# Patient Record
Sex: Female | Born: 1990 | Hispanic: Yes | Smoking: Never smoker
Health system: Southern US, Community
[De-identification: ages and names within clinical notes are randomized; demographics above are authoritative.]

## PROBLEM LIST (undated history)

## (undated) DIAGNOSIS — O1490 Unspecified pre-eclampsia, unspecified trimester: Secondary | ICD-10-CM

## (undated) DIAGNOSIS — M7989 Other specified soft tissue disorders: Secondary | ICD-10-CM

## (undated) DIAGNOSIS — F419 Anxiety disorder, unspecified: Secondary | ICD-10-CM

## (undated) DIAGNOSIS — F32A Depression, unspecified: Secondary | ICD-10-CM

## (undated) DIAGNOSIS — K859 Acute pancreatitis without necrosis or infection, unspecified: Secondary | ICD-10-CM

## (undated) DIAGNOSIS — G43909 Migraine, unspecified, not intractable, without status migrainosus: Secondary | ICD-10-CM

## (undated) DIAGNOSIS — I83813 Varicose veins of bilateral lower extremities with pain: Secondary | ICD-10-CM

## (undated) DIAGNOSIS — F329 Major depressive disorder, single episode, unspecified: Secondary | ICD-10-CM

## (undated) HISTORY — PX: NO PAST SURGERIES: SHX2092

---

## 2010-09-17 ENCOUNTER — Inpatient Hospital Stay (HOSPITAL_COMMUNITY)
Admission: AD | Admit: 2010-09-17 | Discharge: 2010-09-17 | Payer: Self-pay | Source: Home / Self Care | Attending: Obstetrics & Gynecology | Admitting: Obstetrics & Gynecology

## 2010-10-10 DIAGNOSIS — O1490 Unspecified pre-eclampsia, unspecified trimester: Secondary | ICD-10-CM

## 2010-10-10 HISTORY — DX: Unspecified pre-eclampsia, unspecified trimester: O14.90

## 2010-10-28 ENCOUNTER — Ambulatory Visit (HOSPITAL_COMMUNITY)
Admission: RE | Admit: 2010-10-28 | Discharge: 2010-10-28 | Payer: Self-pay | Source: Home / Self Care | Attending: Obstetrics & Gynecology | Admitting: Obstetrics & Gynecology

## 2010-12-21 LAB — COMPREHENSIVE METABOLIC PANEL
AST: 26 U/L (ref 0–37)
Albumin: 3.5 g/dL (ref 3.5–5.2)
Calcium: 9.3 mg/dL (ref 8.4–10.5)
Creatinine, Ser: 0.43 mg/dL (ref 0.4–1.2)
GFR calc Af Amer: >60 mL/min (ref >60)
Sodium: 135 mEq/L (ref 135–145)
Total Protein: 7.3 g/dL (ref 6.0–8.3)

## 2010-12-21 LAB — URINALYSIS, ROUTINE W REFLEX MICROSCOPIC
Bilirubin Urine: NEGATIVE
Ketones, ur: NEGATIVE mg/dL
Nitrite: NEGATIVE
Specific Gravity, Urine: 1.015 (ref 1.005–1.030)
Urobilinogen, UA: 0.2 mg/dL (ref 0.0–1.0)

## 2010-12-21 LAB — DIFFERENTIAL
Eosinophils Relative: 1 % (ref 0–5)
Lymphocytes Relative: 11 % — ABNORMAL LOW (ref 12–46)
Lymphs Abs: 0.8 10*3/uL (ref 0.7–4.0)
Monocytes Absolute: 0.6 10*3/uL (ref 0.1–1.0)
Monocytes Relative: 8 % (ref 3–12)

## 2010-12-21 LAB — CBC
MCH: 32 pg (ref 26.0–34.0)
MCHC: 34.3 g/dL (ref 30.0–36.0)
Platelets: 163 10*3/uL (ref 150–400)

## 2010-12-21 LAB — VARICELLA ZOSTER ANTIBODY, IGG: Varicella IgG: 0.85 {ISR}

## 2011-03-09 ENCOUNTER — Inpatient Hospital Stay (HOSPITAL_COMMUNITY)
Admission: EM | Admit: 2011-03-09 | Discharge: 2011-03-12 | DRG: 774 | Disposition: A | Discharge status: Home / Self Care | Payer: Medicaid Other | Source: Ambulatory Visit | Attending: Family Medicine | Admitting: Family Medicine

## 2011-03-09 DIAGNOSIS — O4290 Premature rupture of membranes, unspecified as to length of time between rupture and onset of labor, unspecified weeks of gestation: Principal | ICD-10-CM | POA: Diagnosis present

## 2011-03-09 DIAGNOSIS — IMO0002 Reserved for concepts with insufficient information to code with codable children: Secondary | ICD-10-CM | POA: Diagnosis not present

## 2011-03-09 LAB — COMPREHENSIVE METABOLIC PANEL
ALT: 15 U/L (ref 0–35)
AST: 22 U/L (ref 0–37)
CO2: 18 mEq/L — ABNORMAL LOW (ref 19–32)
Chloride: 106 mEq/L (ref 96–112)
Creatinine, Ser: 0.66 mg/dL (ref 0.4–1.2)
GFR calc Af Amer: >60 mL/min (ref >60)
GFR calc non Af Amer: >60 mL/min (ref >60)
Total Bilirubin: 0.2 mg/dL — ABNORMAL LOW (ref 0.3–1.2)

## 2011-03-09 LAB — URINALYSIS, DIPSTICK ONLY
Bilirubin Urine: NEGATIVE
Ketones, ur: NEGATIVE mg/dL
Nitrite: NEGATIVE
Specific Gravity, Urine: 1.01 (ref 1.005–1.030)
Urobilinogen, UA: 0.2 mg/dL (ref 0.0–1.0)

## 2011-03-09 LAB — CBC
HCT: 38.7 % (ref 36.0–46.0)
Hemoglobin: 12.8 g/dL (ref 12.0–15.0)
MCH: 30.2 pg (ref 26.0–34.0)
RBC: 4.24 MIL/uL (ref 3.87–5.11)

## 2011-03-10 DIAGNOSIS — IMO0002 Reserved for concepts with insufficient information to code with codable children: Secondary | ICD-10-CM

## 2011-03-10 DIAGNOSIS — O4290 Premature rupture of membranes, unspecified as to length of time between rupture and onset of labor, unspecified weeks of gestation: Secondary | ICD-10-CM

## 2011-03-11 ENCOUNTER — Inpatient Hospital Stay (HOSPITAL_COMMUNITY): Admission: AD | Admit: 2011-03-11 | Payer: Self-pay | Source: Home / Self Care | Admitting: Obstetrics & Gynecology

## 2011-03-11 LAB — CBC
HCT: 22.7 % — ABNORMAL LOW (ref 36.0–46.0)
Hemoglobin: 7.5 g/dL — ABNORMAL LOW (ref 12.0–15.0)
MCV: 91.9 fL (ref 78.0–100.0)
RBC: 2.47 MIL/uL — ABNORMAL LOW (ref 3.87–5.11)
WBC: 18.9 10*3/uL — ABNORMAL HIGH (ref 4.0–10.5)

## 2011-03-14 ENCOUNTER — Inpatient Hospital Stay (HOSPITAL_COMMUNITY)
Admission: AD | Admit: 2011-03-14 | Discharge: 2011-03-15 | Disposition: A | Discharge status: Home / Self Care | Payer: Self-pay | Source: Ambulatory Visit | Attending: Obstetrics & Gynecology | Admitting: Obstetrics & Gynecology

## 2011-03-14 DIAGNOSIS — O99893 Other specified diseases and conditions complicating puerperium: Secondary | ICD-10-CM

## 2011-03-14 DIAGNOSIS — O9989 Other specified diseases and conditions complicating pregnancy, childbirth and the puerperium: Secondary | ICD-10-CM

## 2011-03-14 DIAGNOSIS — L509 Urticaria, unspecified: Secondary | ICD-10-CM | POA: Insufficient documentation

## 2012-08-14 ENCOUNTER — Encounter (HOSPITAL_COMMUNITY): Payer: Self-pay | Admitting: *Deleted

## 2012-08-14 ENCOUNTER — Inpatient Hospital Stay (HOSPITAL_COMMUNITY)
Admission: EM | Admit: 2012-08-14 | Discharge: 2012-08-19 | DRG: 417 | Disposition: A | Discharge status: Home / Self Care | Payer: Medicaid Other | Attending: Internal Medicine | Admitting: Internal Medicine

## 2012-08-14 DIAGNOSIS — I10 Essential (primary) hypertension: Secondary | ICD-10-CM | POA: Diagnosis present

## 2012-08-14 DIAGNOSIS — Z9049 Acquired absence of other specified parts of digestive tract: Secondary | ICD-10-CM

## 2012-08-14 DIAGNOSIS — K851 Biliary acute pancreatitis without necrosis or infection: Secondary | ICD-10-CM | POA: Diagnosis present

## 2012-08-14 DIAGNOSIS — K8020 Calculus of gallbladder without cholecystitis without obstruction: Secondary | ICD-10-CM

## 2012-08-14 DIAGNOSIS — Z23 Encounter for immunization: Secondary | ICD-10-CM

## 2012-08-14 DIAGNOSIS — K8010 Calculus of gallbladder with chronic cholecystitis without obstruction: Principal | ICD-10-CM | POA: Diagnosis present

## 2012-08-14 DIAGNOSIS — K859 Acute pancreatitis without necrosis or infection, unspecified: Secondary | ICD-10-CM | POA: Diagnosis present

## 2012-08-14 HISTORY — DX: Depression, unspecified: F32.A

## 2012-08-14 HISTORY — DX: Unspecified pre-eclampsia, unspecified trimester: O14.90

## 2012-08-14 HISTORY — DX: Anxiety disorder, unspecified: F41.9

## 2012-08-14 HISTORY — DX: Other specified soft tissue disorders: M79.89

## 2012-08-14 HISTORY — DX: Major depressive disorder, single episode, unspecified: F32.9

## 2012-08-14 HISTORY — DX: Varicose veins of bilateral lower extremities with pain: I83.813

## 2012-08-14 HISTORY — DX: Migraine, unspecified, not intractable, without status migrainosus: G43.909

## 2012-08-14 HISTORY — DX: Acute pancreatitis without necrosis or infection, unspecified: K85.90

## 2012-08-14 NOTE — ED Notes (Signed)
Pt in c/o epigastric abd pain over last month, states pain is intermittent but today pain has increased, pain is worse with food, some episodes of n/v

## 2012-08-14 NOTE — ED Notes (Signed)
Pt states that everyday after she eats she has pain in her abdomen. Pt states that the pain is burning pain epigastric that goes through to her back. Pt states that she is N/V a few times after she eats.

## 2012-08-15 ENCOUNTER — Emergency Department (HOSPITAL_COMMUNITY): Payer: Medicaid Other

## 2012-08-15 ENCOUNTER — Encounter (HOSPITAL_COMMUNITY): Payer: Self-pay | Admitting: Radiology

## 2012-08-15 DIAGNOSIS — K851 Biliary acute pancreatitis without necrosis or infection: Secondary | ICD-10-CM | POA: Diagnosis present

## 2012-08-15 DIAGNOSIS — K859 Acute pancreatitis without necrosis or infection, unspecified: Secondary | ICD-10-CM

## 2012-08-15 DIAGNOSIS — K8050 Calculus of bile duct without cholangitis or cholecystitis without obstruction: Secondary | ICD-10-CM

## 2012-08-15 HISTORY — DX: Acute pancreatitis without necrosis or infection, unspecified: K85.90

## 2012-08-15 LAB — URINALYSIS, ROUTINE W REFLEX MICROSCOPIC
Glucose, UA: NEGATIVE mg/dL
Nitrite: NEGATIVE
Protein, ur: NEGATIVE mg/dL
Urobilinogen, UA: 0.2 mg/dL (ref 0.0–1.0)

## 2012-08-15 LAB — CREATININE, SERUM: Creatinine, Ser: 0.55 mg/dL (ref 0.50–1.10)

## 2012-08-15 LAB — CBC WITH DIFFERENTIAL/PLATELET
Basophils Absolute: 0 10*3/uL (ref 0.0–0.1)
Eosinophils Absolute: 0 10*3/uL (ref 0.0–0.7)
Lymphocytes Relative: 6 % — ABNORMAL LOW (ref 12–46)
Lymphs Abs: 1.3 10*3/uL (ref 0.7–4.0)
MCH: 29.2 pg (ref 26.0–34.0)
Neutrophils Relative %: 90 % — ABNORMAL HIGH (ref 43–77)
Platelets: 288 10*3/uL (ref 150–400)
RBC: 4.42 MIL/uL (ref 3.87–5.11)
WBC: 21.2 10*3/uL — ABNORMAL HIGH (ref 4.0–10.5)

## 2012-08-15 LAB — COMPREHENSIVE METABOLIC PANEL
BUN: 11 mg/dL (ref 6–23)
CO2: 26 mEq/L (ref 19–32)
Chloride: 100 mEq/L (ref 96–112)
Creatinine, Ser: 0.63 mg/dL (ref 0.50–1.10)
GFR calc non Af Amer: >90 mL/min (ref >90)
Total Bilirubin: 0.8 mg/dL (ref 0.3–1.2)

## 2012-08-15 LAB — CBC
Hemoglobin: 12.6 g/dL (ref 12.0–15.0)
MCH: 28.9 pg (ref 26.0–34.0)
RBC: 4.36 MIL/uL (ref 3.87–5.11)

## 2012-08-15 LAB — LIPID PANEL
Cholesterol: 119 mg/dL (ref 0–200)
Total CHOL/HDL Ratio: 2.1 RATIO
Triglycerides: 61 mg/dL (ref <150)
VLDL: 12 mg/dL (ref 0–40)

## 2012-08-15 LAB — URINE MICROSCOPIC-ADD ON

## 2012-08-15 LAB — PREGNANCY, URINE: Preg Test, Ur: NEGATIVE

## 2012-08-15 MED ORDER — ENOXAPARIN SODIUM 40 MG/0.4ML ~~LOC~~ SOLN
40.0000 mg | SUBCUTANEOUS | Status: DC
  Administered 2012-08-15 – 2012-08-16 (×2): 40 mg via SUBCUTANEOUS
  Filled 2012-08-15 (×3): qty 0.4

## 2012-08-15 MED ORDER — IOHEXOL 300 MG/ML  SOLN
100.0000 mL | Freq: Once | INTRAMUSCULAR | Status: AC | PRN
  Administered 2012-08-15: 100 mL via INTRAVENOUS

## 2012-08-15 MED ORDER — ONDANSETRON HCL 4 MG/2ML IJ SOLN: 4.0000 mg | Freq: Four times a day (QID) | INTRAMUSCULAR | Status: DC | PRN

## 2012-08-15 MED ORDER — IOHEXOL 300 MG/ML  SOLN
20.0000 mL | INTRAMUSCULAR | Status: AC
  Administered 2012-08-15: 20 mL via ORAL

## 2012-08-15 MED ORDER — ONDANSETRON HCL 4 MG/2ML IJ SOLN
INTRAMUSCULAR | Status: AC
  Administered 2012-08-15: 4 mg
  Filled 2012-08-15: qty 2

## 2012-08-15 MED ORDER — GI COCKTAIL ~~LOC~~
30.0000 mL | Freq: Once | ORAL | Status: AC
  Administered 2012-08-15: 30 mL via ORAL
  Filled 2012-08-15: qty 30

## 2012-08-15 MED ORDER — HYDROMORPHONE HCL PF 1 MG/ML IJ SOLN
1.0000 mg | Freq: Once | INTRAMUSCULAR | Status: AC
  Administered 2012-08-15: 1 mg via INTRAVENOUS
  Filled 2012-08-15: qty 1

## 2012-08-15 MED ORDER — ONDANSETRON HCL 4 MG PO TABS: 4.0000 mg | ORAL_TABLET | Freq: Four times a day (QID) | ORAL | Status: DC | PRN

## 2012-08-15 MED ORDER — MORPHINE SULFATE 2 MG/ML IJ SOLN
2.0000 mg | INTRAMUSCULAR | Status: DC | PRN
  Administered 2012-08-15 (×2): 2 mg via INTRAVENOUS
  Filled 2012-08-15 (×2): qty 1

## 2012-08-15 MED ORDER — SODIUM CHLORIDE 0.9 % IV SOLN: INTRAVENOUS | Status: DC

## 2012-08-15 MED ORDER — INFLUENZA VIRUS VACC SPLIT PF IM SUSP
0.5000 mL | INTRAMUSCULAR | Status: AC
  Administered 2012-08-16: 0.5 mL via INTRAMUSCULAR
  Filled 2012-08-15: qty 0.5

## 2012-08-15 MED ORDER — SODIUM CHLORIDE 0.9 % IV SOLN
INTRAVENOUS | Status: DC
  Administered 2012-08-15 (×3): via INTRAVENOUS
  Administered 2012-08-16: 1000 mL via INTRAVENOUS
  Administered 2012-08-16: via INTRAVENOUS
  Administered 2012-08-16 (×2): 1000 mL via INTRAVENOUS
  Administered 2012-08-17: 05:00:00 via INTRAVENOUS

## 2012-08-15 NOTE — H&P (Signed)
Date: 08/15/2012               Patient Name:  Mary Mcintosh MRN: [id number]  DOB: May 21, 1991 Age / Sex: 21 y.o., female   PCP: None              Medical Service: Internal Medicine Teaching Service              Attending Physician: Dr. Meredith Pel    First Contact: Dr. Heloise Beecham Pager: [phone]  Second Contact: Dr. Bosie Clos Pager: [phone]            After Hours (After 5p/  First Contact Pager: [phone]  weekends / holidays): Second Contact Pager: [phone]     Chief Complaint: N/V/abdominal pain.  History of Present Illness: Patient is a 21 y.o. female with no known PMHx, who presents to [facility] for evaluation of 3 day history of intermittent, epigastric abdominal pain with radiation to the back that has gradually worsening, especially over the last 1 day (starting at [facility] on 11/5). This prompted her ER evaluation today.Pain is described as a burning epigastric pain that radiates to the back. She additionally experienced 3-4 episodes of vomiting of a nonbloody nonbilious vomitus. Aggravating factors include: drinking or drinking.  Alleviating factors include: none. Associated symptoms include: constipation and dysuria. The patient denies chills, fever, hematochezia, hematuria and melena. Patient states she had similar (but less intense) episodes when she was 21 years old, for approximately a year, it was never determined the cause of these attacks, and eventually she grew out of it.  Otherwise, patient states she takes no new medications, no recent increased alcohol consumption, and no family or personal history of hypertriglyceridemia. She has never previously had abdominal sugeries.   Review of Systems: Constitutional:  admits to decreased appetite. Denies fever, chills, diaphoresis, appetite change and fatigue.  HEENT: denies photophobia, eye pain, redness, hearing loss, ear pain, congestion, sore throat, rhinorrhea, sneezing, neck pain, neck stiffness and tinnitus.  Respiratory:  denies SOB, DOE, cough, chest tightness, and wheezing.  Cardiovascular: denies chest pain, palpitations and leg swelling.  Gastrointestinal: admits to nausea, vomiting, abdominal pain, constipation. Denies blood in stool.  Genitourinary: admits to dysuria. Denies urgency, frequency, hematuria, flank pain and difficulty urinating.  Musculoskeletal: denies  myalgias, back pain, joint swelling, arthralgias and gait problem.   Skin: denies pallor, rash and wound.  Neurological: denies dizziness, seizures, syncope, weakness, light-headedness, numbness and headaches.   Hematological: denies adenopathy, easy bruising, personal or family bleeding history.  Psychiatric/ Behavioral: denies suicidal ideation, mood changes, confusion, nervousness, sleep disturbance and agitation.    Current Outpatient Medications: Current Facility-Administered Medications  Medication Dose Route Frequency Provider Last Rate Last Dose  . [COMPLETED] gi cocktail (Maalox,Lidocaine,Donnatal)  30 mL Oral Once Vida Roller, MD   30 mL at 08/15/12 0020  . [COMPLETED] HYDROmorphone (DILAUDID) injection 1 mg  1 mg Intravenous Once Vida Roller, MD   1 mg at 08/15/12 0333  . [COMPLETED] iohexol (OMNIPAQUE) 300 MG/ML solution 100 mL  100 mL Intravenous Once PRN Medication Radiologist, MD   100 mL at 08/15/12 0535  . [EXPIRED] iohexol (OMNIPAQUE) 300 MG/ML solution 20 mL  20 mL Oral Q1 Hr x 2 Medication Radiologist, MD   20 mL at 08/15/12 0340  . [COMPLETED] ondansetron (ZOFRAN) 4 MG/2ML injection        4 mg at 08/15/12 0022   No current outpatient prescriptions on file.    Allergies: No Known Allergies   Past Medical  History: Past Medical History  Diagnosis Date  . Pre-eclampsia     with last child in 2012    Past Surgical History: History reviewed. No pertinent past surgical history.  Family History: History reviewed. No pertinent family history.  Social History: History   Social History  . Marital  Status: Single    Spouse Name: N/A    Number of Children: 1  . Years of Education: 9th   Occupational History  . [employer]    Social History Main Topics  . Smoking status: Never Smoker   . Smokeless tobacco: Not on file  . Alcohol Use: Yes     Comment: occasional - 1 drink a month  . Drug Use: No  . Sexually Active: Not on file   Other Topics Concern  . Not on file   Social History Narrative   Lives in [place] with her boyfriend and child     Vital Signs: Blood pressure 101/53, pulse 72, temperature 98.4 F (36.9 C), temperature source Oral, resp. rate 16, last menstrual period 07/29/2012, SpO2 98.00%.  Physical Exam: General: Vital signs reviewed and noted. Well-developed, well-nourished, in no acute distress; alert, appropriate and cooperative throughout examination.  Head: Normocephalic, atraumatic.  Eyes: PERRL, EOMI, No signs of anemia or jaundince.  Nose: Mucous membranes moist, not inflammed, nonerythematous.  Throat: Oropharynx nonerythematous, no exudate appreciated.   Neck: No deformities, masses, or tenderness noted.Supple, No carotid Bruits, no JVD.  Lungs:  Normal respiratory effort. Clear to auscultation BL without crackles or wheezes.  Heart: RRR. S1 and S2 normal without gallop, murmur, or rubs.  Abdomen:  BS normoactive. Soft, Nondistended,. Epigastric tenderness to palpation and RUQ, with negative Murphy sign No masses or organomegaly.  Extremities: No pretibial edema.  Neurologic: A&O X3, CN II - XII are grossly intact. Motor strength is 5/5 in the all 4 extremities, Sensations intact to light touch, Cerebellar signs negative.  Skin: No visible rashes, scars.   Lab results: Comprehensive Metabolic Panel:    Component Value Date/Time   NA 136 08/15/2012 0019   K 3.8 08/15/2012 0019   CL 100 08/15/2012 0019   CO2 26 08/15/2012 0019   BUN 11 08/15/2012 0019   CREATININE 0.63 08/15/2012 0019   GLUCOSE 159* 08/15/2012 0019   CALCIUM 9.9 08/15/2012  0019   AST 84* 08/15/2012 0019   ALT 54* 08/15/2012 0019   ALKPHOS 92 08/15/2012 0019   BILITOT 0.8 08/15/2012 0019   PROT 8.0 08/15/2012 0019   ALBUMIN 4.3 08/15/2012 0019    Lipase     Component Value Date/Time   LIPASE >3000* 08/15/2012 0019   CBC:    Component Value Date/Time   WBC 21.2* 08/15/2012 0019   HGB 12.9 08/15/2012 0019   HCT 38.5 08/15/2012 0019   PLT 288 08/15/2012 0019   MCV 87.1 08/15/2012 0019   NEUTROABS 19.2* 08/15/2012 0019   LYMPHSABS 1.3 08/15/2012 0019   MONOABS 0.7 08/15/2012 0019   EOSABS 0.0 08/15/2012 0019   BASOSABS 0.0 08/15/2012 0019    Urinalysis    Component Value Date/Time   COLORURINE YELLOW 08/15/2012 0314   APPEARANCEUR CLEAR 08/15/2012 0314   LABSPEC 1.012 08/15/2012 0314   PHURINE 8.5* 08/15/2012 0314   GLUCOSEU NEGATIVE 08/15/2012 0314   HGBUR NEGATIVE 08/15/2012 0314   BILIRUBINUR NEGATIVE 08/15/2012 0314   KETONESUR 15* 08/15/2012 0314   PROTEINUR NEGATIVE 08/15/2012 0314   UROBILINOGEN 0.2 08/15/2012 0314   NITRITE NEGATIVE 08/15/2012 0314   LEUKOCYTESUR TRACE* 08/15/2012 0314  Imaging results:   US Abdomen Complete (08/15/2012) - 1.  Question of trace fluid about the pancreas.  Given the patient's elevated lipase, pancreatitis is considered likely. 2.  Cholelithiasis; apparent thickening of the gallbladder wall to 0.5 cm, without additional evidence to suggest cholecystitis.  No ultrasonographic Murphy's sign seen. 3.  Scattered calcified granulomata within the spleen.   Original Report Authenticated By: Tonia Ghent, M.D.    Ct Abdomen Pelvis W Contrast (08/15/2012) - 1.  Acute pancreatitis noted, with fluid tracking about the entirety of the pancreas, and extending inferiorly along Gerota's fascia bilaterally, more prominent on the left.  Associated soft tissue inflammation seen.  No definite evidence of devascularization or pseudocyst formation, though evaluation is mildly suboptimal due to motion artifact. 2.  Small amount of pericholecystic fluid  about the gallbladder may be reactive in nature; known cholelithiasis not well characterized on CT.  No evidence to suggest obstruction. 3.  Intrauterine device noted in abnormally low position in the uterus, with one of the prongs apparently extending into the myometrium.  Recommend clinical correlation and adjustment.   Original Report Authenticated By: Tonia Ghent, M.D.     Assessment & Plan:  Pt is a 21 y.o. yo female with no known PMHx who was admitted on 08/14/2012 with symptoms of nausea, vomiting, and abdominal pain x 3 days, worst over 1 day PTA, which was determined to be secondary to acute pancreatitis. Interventions at this time will be focused on supportive care, while obtaining a GI consult for further evaluation.    1) Acute pancreatitis - No clear indication of obstructing cholelithiasis on CT or abdominal US. However, ductal evaluation not great per these studies - therefore, stone may be present. She has nonobstructing cholelithiasis without evidence of cholecystitis. Medication or alcohol related pancreatitis is not expected given lack of usage. She also does not indication on physical exam of hypertriglyceridemia. No hypercalcemia to suggest association. Given young age, absence of B symptoms, she is less likely to have a primary malignancy as cause of pancreatitis.  Plan: - Will obtain GI consult to consider for further evaluation of cause of this pancreatitis (with consideration for possible endocscopy etiology), as well if decompression is needed.  - NPO. - Will add IVF @ 200 cc/hr x 8 hours, then 150 cc/hr. - Will check serum triglycerides. - Check hepatitis panel, HIV.    DVT PPX - low molecular weight heparin  CODE STATUS - FULL code  Dispo - Deferred until GI evaluation, symptoms improved, pancreatitis improved clinically.    Signed: Johnette Abraham, Roma Schanz, Internal Medicine Resident Pager: [phone] (7AM-5PM) 08/15/2012, 7:45 AM

## 2012-08-15 NOTE — ED Provider Notes (Signed)
History     CSN: [id number]  Arrival date & time 08/14/12  2128   First MD Initiated Contact with Patient 08/15/12 0011      Chief Complaint  Patient presents with  . Abdominal Pain    (Consider location/radiation/quality/duration/timing/severity/associated sxs/prior treatment) HPI Comments: 21 year old female with a history of epigastric abdominal pain that has been going on approximately one month, worse in the evening, worse after meals and associated with nausea and vomiting after she eats. The pain is burning in nature, radiates to her back, is not associated with fever chills, cough, shortness of breath, chest pain, diarrhea, dysuria, vaginal bleeding, swelling, rashes. The symptoms are moderate to severe at this time. She has had no medications for this. She does have a history of using over-the-counter medications intermittently such as ibuprofen.  Patient is a 21 y.o. female presenting with abdominal pain. The history is provided by the patient.  Abdominal Pain The primary symptoms of the illness include abdominal pain.    Past Medical History  Diagnosis Date  . Hypertension     History reviewed. No pertinent past surgical history.  History reviewed. No pertinent family history.  History  Substance Use Topics  . Smoking status: Never Smoker   . Smokeless tobacco: Not on file  . Alcohol Use: No    OB History    Grav Para Term Preterm Abortions TAB SAB Ect Mult Living                  Review of Systems  Gastrointestinal: Positive for abdominal pain.  All other systems reviewed and are negative.    Allergies  Review of patient's allergies indicates no known allergies.  Home Medications  No current outpatient prescriptions on file.  BP 114/64  Pulse 69  Temp 98.4 F (36.9 C) (Oral)  Resp 16  SpO2 96%  LMP 07/29/2012  Physical Exam  Nursing note and vitals reviewed. Constitutional: She appears well-developed and well-nourished.   Uncomfortable appearing  HENT:  Head: Normocephalic and atraumatic.  Mouth/Throat: Oropharynx is clear and moist. No oropharyngeal exudate.  Eyes: Conjunctivae normal and EOM are normal. Pupils are equal, round, and reactive to light. Right eye exhibits no discharge. Left eye exhibits no discharge. No scleral icterus.  Neck: Normal range of motion. Neck supple. No JVD present. No thyromegaly present.  Cardiovascular: Normal rate, regular rhythm, normal heart sounds and intact distal pulses.  Exam reveals no gallop and no friction rub.   No murmur heard. Pulmonary/Chest: Effort normal and breath sounds normal. No respiratory distress. She has no wheezes. She has no rales.  Abdominal: Soft. Bowel sounds are normal. She exhibits no distension and no mass. There is tenderness ( Patient has focal right upper quadrant and epigastric tenderness to palpation without guarding or masses. There is no peritoneal signs, the abdomen is very soft and no pain at the right lower quadrant).  Musculoskeletal: Normal range of motion. She exhibits no edema and no tenderness.  Lymphadenopathy:    She has no cervical adenopathy.  Neurological: She is alert. Coordination normal.  Skin: Skin is warm and dry. No rash noted. No erythema.  Psychiatric: She has a normal mood and affect. Her behavior is normal.    ED Course  Procedures (including critical care time)  Labs Reviewed  COMPREHENSIVE METABOLIC PANEL - Abnormal; Notable for the following:    Glucose, Bld 159 (*)     AST 84 (*)     ALT 54 (*)  All other components within normal limits  CBC WITH DIFFERENTIAL - Abnormal; Notable for the following:    WBC 21.2 (*)     Neutrophils Relative 90 (*)     Neutro Abs 19.2 (*)     Lymphocytes Relative 6 (*)     All other components within normal limits  URINALYSIS, ROUTINE W REFLEX MICROSCOPIC - Abnormal; Notable for the following:    pH 8.5 (*)     Ketones, ur 15 (*)     Leukocytes, UA TRACE (*)     All  other components within normal limits  LIPASE, BLOOD - Abnormal; Notable for the following:    Lipase >3000 (*)  RESULT CONFIRMED BY AUTOMATED DILUTION   All other components within normal limits  URINE MICROSCOPIC-ADD ON - Abnormal; Notable for the following:    Squamous Epithelial / LPF MANY (*)     All other components within normal limits  PREGNANCY, URINE   US Abdomen Complete  08/15/2012  *RADIOLOGY REPORT*  Clinical Data:  Right upper quadrant abdominal pain, elevated LFTs, leukocytosis and elevated lipase.  ABDOMINAL ULTRASOUND COMPLETE  Comparison:  None  Findings:  Gallbladder: Multiple stones of varying size are noted within the gallbladder, several which are adherent to the gallbladder wall. The gallbladder wall appears thickened, measuring 0.5 cm, but no significant pericholecystic fluid is seen, and no ultrasonographic Murphy's sign is elicited.  Common Bile Duct:  0.5 cm in diameter; within normal limits in caliber.  Liver:  Normal parenchymal echogenicity and echotexture; no focal lesions identified.  Limited Doppler evaluation demonstrates normal blood flow within the liver.  IVC:  Unremarkable in appearance.  Pancreas: There is question of trace fluid about the pancreas, though this is not well characterized on ultrasound.  Given the patient's elevated lipase, pancreatitis is considered likely.  Spleen:  9.4 cm in length; within normal limits in size and echotexture.  Scattered small foci of increased echogenicity within the spleen likely reflect calcified granulomata, measuring up to 3 mm in size.  Right kidney:  10.2 cm in length; normal in size, configuration and parenchymal echogenicity.  No evidence of mass or hydronephrosis.  Left kidney:  11.0 cm in length; normal in size, configuration and parenchymal echogenicity.  No evidence of mass or hydronephrosis.  Abdominal Aorta:  Normal in caliber; no aneurysm identified.  IMPRESSION:  1.  Question of trace fluid about the pancreas.   Given the patient's elevated lipase, pancreatitis is considered likely. 2.  Cholelithiasis; apparent thickening of the gallbladder wall to 0.5 cm, without additional evidence to suggest cholecystitis.  No ultrasonographic Murphy's sign seen. 3.  Scattered calcified granulomata within the spleen.   Original Report Authenticated By: Tonia Ghent, M.D.    Ct Abdomen Pelvis W Contrast  08/15/2012  *RADIOLOGY REPORT*  Clinical Data: Abdominal pain, radiating to the back; nausea and vomiting.  CT ABDOMEN AND PELVIS WITH CONTRAST  Technique:  Multidetector CT imaging of the abdomen and pelvis was performed following the standard protocol during bolus administration of intravenous contrast.  Contrast: OMNIPAQUE IOHEXOL 300 MG/ML  SOLN  Comparison: Abdominal ultrasound performed earlier today at 01:44 a.m.  Findings: The visualized lung bases are clear.  There is fluid noted tracking about the entirety of the pancreas, extending inferiorly along Gerota's fascia bilaterally, more prominent on the left.  Associated soft tissue inflammation is noted.  Findings are compatible with acute pancreatitis.  There is no definite evidence of devascularization or pseudocyst formation, though the pancreas is difficult to  fully assess due to motion artifact.  A small amount of pericholecystic fluid is noted about the gallbladder.  This may be reactive in nature.  Known gallstones are not well characterized on CT.  There is no evidence of common hepatic duct dilatation to suggest obstruction.  The adrenal glands are unremarkable in appearance.  The spleen is within normal limits.  The liver is grossly unremarkable in appearance.  The kidneys are unremarkable in appearance.  There is no evidence of hydronephrosis.  No renal or ureteral stones are seen.  No perinephric stranding is appreciated.  No free fluid is identified.  The small bowel is unremarkable in appearance.  The stomach is within normal limits.  No acute vascular  abnormalities are seen.  The appendix is not definitely seen; there is no evidence for appendicitis.  The colon is grossly unremarkable in appearance.  The bladder is moderately distended and grossly normal in appearance.  The patient's intrauterine device is noted in abnormally low position, with one of the prongs apparently extending into the myometrium.  The uterus is otherwise unremarkable in appearance.  The ovaries are relatively symmetric, though difficult to fully characterize; no suspicious adnexal masses are seen.  No inguinal lymphadenopathy is seen.  No acute osseous abnormalities are identified.  IMPRESSION:  1.  Acute pancreatitis noted, with fluid tracking about the entirety of the pancreas, and extending inferiorly along Gerota's fascia bilaterally, more prominent on the left.  Associated soft tissue inflammation seen.  No definite evidence of devascularization or pseudocyst formation, though evaluation is mildly suboptimal due to motion artifact. 2.  Small amount of pericholecystic fluid about the gallbladder may be reactive in nature; known cholelithiasis not well characterized on CT.  No evidence to suggest obstruction. 3.  Intrauterine device noted in abnormally low position in the uterus, with one of the prongs apparently extending into the myometrium.  Recommend clinical correlation and adjustment.   Original Report Authenticated By: Tonia Ghent, M.D.      1. Pancreatitis       MDM  Abdominal pain consistent with a pancreatitis, cholecystitis or a gastritis, vital signs are normal, abdomen is benign, we'll pursue labs, GI cocktail, urine pregnancy  Laboratory data reviewed, lipase over 3000, unmeasurable high, CBC shows significant leukocytosis over 20,000, urinalysis clear but with dehydration and ketonuria, mild transaminitis but no elevated bilirubin. Ultrasound reveals cholelithiasis with inflammatory changes and possible inflammatory changes around the pancreas, CT scan  reveals significant acute pancreatitis without obvious pseudocyst formation. Care discussed with the internal medicine resident who will admit to the hospital.  Dilaudid given for pain with some improvement        Vida Roller, MD 08/15/12 0710

## 2012-08-15 NOTE — ED Notes (Signed)
Pt finished contrast CT aware

## 2012-08-15 NOTE — ED Notes (Signed)
Pt with episode of vomiting after taking GI cocktail, given zofran, MD Hyacinth Meeker notified.

## 2012-08-15 NOTE — ED Notes (Signed)
Called report to [place], Charity fundraiser - she will have to callback to complete report.

## 2012-08-15 NOTE — Consult Note (Signed)
Referring Provider: No ref. provider found Primary Care Physician:  Sheila Oats, MD Primary Gastroenterologist:  None, unassigned  Reason for Consultation:  Pancreatitis  HPI: Mary Mcintosh is a 21 y.o. female presented to [facility] ED with complaints of severe epigastric abdominal pain.  Was found to have lipase >3000 and CT scan confirmed pancreatitis without complications.  Has gallstones on ultrasound, but no signs of biliary dilitation or retained CBD stone.  AST and ALT minimally elevated but ALP and total bili are normal.  Slightly elevated WBC count, improved since yesterday.  Is on IVF's at 200cc/hr.  Minimal ETOH use and none recently, no family history of pancreatitis to her knowledge, triglycerides are normal, no medications at home including OTC meds and suppelments.  Had similar episode of pain one week ago, but resolved on its own in 30 minutes without intervention and was not as severe.   Past Medical History  Diagnosis Date  . Pre-eclampsia     with last child in 2012    History reviewed. No pertinent past surgical history.  Prior to Admission medications   Not on File    Current Facility-Administered Medications  Medication Dose Route Frequency Provider Last Rate Last Dose  . 0.9 %  sodium chloride infusion   Intravenous Continuous Maitri S Kalia-Reynolds, DO 200 mL/hr at 08/15/12 1136     Followed by  . 0.9 %  sodium chloride infusion   Intravenous Continuous Maitri S Kalia-Reynolds, DO      . enoxaparin (LOVENOX) injection 40 mg  40 mg Subcutaneous Q24H Maitri S Kalia-Reynolds, DO      . [COMPLETED] gi cocktail (Maalox,Lidocaine,Donnatal)  30 mL Oral Once Vida Roller, MD   30 mL at 08/15/12 0020  . [COMPLETED] HYDROmorphone (DILAUDID) injection 1 mg  1 mg Intravenous Once Vida Roller, MD   1 mg at 08/15/12 0333  . [COMPLETED] iohexol (OMNIPAQUE) 300 MG/ML solution 100 mL  100 mL Intravenous Once PRN Medication Radiologist, MD   100 mL at 08/15/12 0535    . [EXPIRED] iohexol (OMNIPAQUE) 300 MG/ML solution 20 mL  20 mL Oral Q1 Hr x 2 Medication Radiologist, MD   20 mL at 08/15/12 0340  . morphine 2 MG/ML injection 2 mg  2 mg Intravenous Q4H PRN Maitri S Kalia-Reynolds, DO      . [COMPLETED] ondansetron (ZOFRAN) 4 MG/2ML injection        4 mg at 08/15/12 0022  . ondansetron (ZOFRAN) tablet 4 mg  4 mg Oral Q6H PRN Maitri S Kalia-Reynolds, DO       Or  . ondansetron (ZOFRAN) injection 4 mg  4 mg Intravenous Q6H PRN Maitri S Kalia-Reynolds, DO        Allergies as of 08/14/2012  . (No Known Allergies)    History reviewed. No pertinent family history.  History   Social History  . Marital Status: Single    Spouse Name: N/A    Number of Children: 1  . Years of Education: 9th   Occupational History  . [employer]    Social History Main Topics  . Smoking status: Never Smoker   . Smokeless tobacco: Not on file  . Alcohol Use: Yes     Comment: occasional - 1 drink a month  . Drug Use: No  . Sexually Active: Not on file   Other Topics Concern  . Not on file   Social History Narrative   Lives in [place] with her boyfriend and child    Review of  Systems: Ten point ROS is O/W negative except as mentioned in HPI.  Physical Exam: Vital signs in last 24 hours: Temp:  [98.4 F (36.9 C)] 98.4 F (36.9 C) (11/06 0913) Pulse Rate:  [66-88] 72  (11/06 0913) Resp:  [16-22] 18  (11/06 0913) BP: (101-141)/(45-75) 104/45 mmHg (11/06 0913) SpO2:  [96 %-100 %] 100 % (11/06 0913) Weight:  [145 lb (65.772 kg)] 145 lb (65.772 kg) (11/06 0913)   General:   Alert,  Well-developed, well-nourished, pleasant and cooperative in NAD. Head:  Normocephalic and atraumatic. Eyes:  Sclera clear, no icterus.  Conjunctiva pink. Ears:  Normal auditory acuity. Mouth:  No deformity or lesions.   Lungs:  Clear throughout to auscultation.  No wheezes, crackles, or rhonchi.  Heart:  Regular rate and rhythm; no murmurs, clicks, rubs,  or  gallops. Abdomen:  Soft, non-distended, BS active, mild epigastric TTP without R/R/G. Rectal:  Deferred.  Msk:  Symmetrical without gross deformities. Pulses:  Normal pulses noted. Extremities:  Without clubbing or edema. Neurologic:  Alert and  oriented x4;  grossly normal neurologically. Skin:  Intact without significant lesions or rashes. Psych:  Alert and cooperative. Normal mood and affect.  Lab Results:  Basename 08/15/12 1055 08/15/12 0019  WBC 16.1* 21.2*  HGB 12.6 12.9  HCT 38.1 38.5  PLT 290 288   BMET  Basename 08/15/12 1055 08/15/12 0019  NA -- 136  K -- 3.8  CL -- 100  CO2 -- 26  GLUCOSE -- 159*  BUN -- 11  CREATININE 0.55 0.63  CALCIUM -- 9.9   LFT  Basename 08/15/12 0019  PROT 8.0  ALBUMIN 4.3  AST 84*  ALT 54*  ALKPHOS 92  BILITOT 0.8  BILIDIR --  IBILI --   Studies/Results: US Abdomen Complete  08/15/2012  *RADIOLOGY REPORT*  Clinical Data:  Right upper quadrant abdominal pain, elevated LFTs, leukocytosis and elevated lipase.  ABDOMINAL ULTRASOUND COMPLETE  Comparison:  None  Findings:  Gallbladder: Multiple stones of varying size are noted within the gallbladder, several which are adherent to the gallbladder wall. The gallbladder wall appears thickened, measuring 0.5 cm, but no significant pericholecystic fluid is seen, and no ultrasonographic Murphy's sign is elicited.  Common Bile Duct:  0.5 cm in diameter; within normal limits in caliber.  Liver:  Normal parenchymal echogenicity and echotexture; no focal lesions identified.  Limited Doppler evaluation demonstrates normal blood flow within the liver.  IVC:  Unremarkable in appearance.  Pancreas: There is question of trace fluid about the pancreas, though this is not well characterized on ultrasound.  Given the patient's elevated lipase, pancreatitis is considered likely.  Spleen:  9.4 cm in length; within normal limits in size and echotexture.  Scattered small foci of increased echogenicity within the  spleen likely reflect calcified granulomata, measuring up to 3 mm in size.  Right kidney:  10.2 cm in length; normal in size, configuration and parenchymal echogenicity.  No evidence of mass or hydronephrosis.  Left kidney:  11.0 cm in length; normal in size, configuration and parenchymal echogenicity.  No evidence of mass or hydronephrosis.  Abdominal Aorta:  Normal in caliber; no aneurysm identified.  IMPRESSION:  1.  Question of trace fluid about the pancreas.  Given the patient's elevated lipase, pancreatitis is considered likely. 2.  Cholelithiasis; apparent thickening of the gallbladder wall to 0.5 cm, without additional evidence to suggest cholecystitis.  No ultrasonographic Murphy's sign seen. 3.  Scattered calcified granulomata within the spleen.   Original Report Authenticated By:  Tonia Ghent, M.D.    Ct Abdomen Pelvis W Contrast  08/15/2012  *RADIOLOGY REPORT*  Clinical Data: Abdominal pain, radiating to the back; nausea and vomiting.  CT ABDOMEN AND PELVIS WITH CONTRAST  Technique:  Multidetector CT imaging of the abdomen and pelvis was performed following the standard protocol during bolus administration of intravenous contrast.  Contrast: OMNIPAQUE IOHEXOL 300 MG/ML  SOLN  Comparison: Abdominal ultrasound performed earlier today at 01:44 a.m.  Findings: The visualized lung bases are clear.  There is fluid noted tracking about the entirety of the pancreas, extending inferiorly along Gerota's fascia bilaterally, more prominent on the left.  Associated soft tissue inflammation is noted.  Findings are compatible with acute pancreatitis.  There is no definite evidence of devascularization or pseudocyst formation, though the pancreas is difficult to fully assess due to motion artifact.  A small amount of pericholecystic fluid is noted about the gallbladder.  This may be reactive in nature.  Known gallstones are not well characterized on CT.  There is no evidence of common hepatic duct dilatation  to suggest obstruction.  The adrenal glands are unremarkable in appearance.  The spleen is within normal limits.  The liver is grossly unremarkable in appearance.  The kidneys are unremarkable in appearance.  There is no evidence of hydronephrosis.  No renal or ureteral stones are seen.  No perinephric stranding is appreciated.  No free fluid is identified.  The small bowel is unremarkable in appearance.  The stomach is within normal limits.  No acute vascular abnormalities are seen.  The appendix is not definitely seen; there is no evidence for appendicitis.  The colon is grossly unremarkable in appearance.  The bladder is moderately distended and grossly normal in appearance.  The patient's intrauterine device is noted in abnormally low position, with one of the prongs apparently extending into the myometrium.  The uterus is otherwise unremarkable in appearance.  The ovaries are relatively symmetric, though difficult to fully characterize; no suspicious adnexal masses are seen.  No inguinal lymphadenopathy is seen.  No acute osseous abnormalities are identified.  IMPRESSION:  1.  Acute pancreatitis noted, with fluid tracking about the entirety of the pancreas, and extending inferiorly along Gerota's fascia bilaterally, more prominent on the left.  Associated soft tissue inflammation seen.  No definite evidence of devascularization or pseudocyst formation, though evaluation is mildly suboptimal due to motion artifact. 2.  Small amount of pericholecystic fluid about the gallbladder may be reactive in nature; known cholelithiasis not well characterized on CT.  No evidence to suggest obstruction. 3.  Intrauterine device noted in abnormally low position in the uterus, with one of the prongs apparently extending into the myometrium.  Recommend clinical correlation and adjustment.   Original Report Authenticated By: Tonia Ghent, M.D.     IMPRESSION:  -Acute pancreatitis:  ? Etiology.  Likely secondary to gallstones  seen on imaging.  No sign of CBD stone/obstruction currently, probably passed a stone.  Has history of similar pain a week ago, but not as severe and resolved on its own after 30 mins.  PLAN: -Agree with aggressive IV hydration, pain meds, and anti-emetics. -Will recheck LFT's tomorrow, as long as they do not rise significantly then I suspect that she will not require ERCP. -Will need cholecystectomy as an outpatient when acute episode resolves.  ? Inpatient surgical consult.  Keondra Haydu D.  08/15/2012, 1:49 PM  Pager number [phone]

## 2012-08-15 NOTE — ED Notes (Signed)
Pt to ultrasound

## 2012-08-15 NOTE — H&P (Signed)
Internal Medicine Attending Admission Note Date: 08/15/2012  Patient name: Mary Mcintosh Medical record number: [id number] Date of birth: [birth year] Age: 21 y.o. Gender: female  I saw and evaluated the patient. I reviewed the resident's note and I agree with the resident's findings and plan as documented in the resident's note, with the following additional comments.  Chief Complaint(s): Abdominal pain, nausea, and vomiting  History - key components related to admission: Patient is a 21 year old woman with no prior history of pancreatitis admitted with complaint of intermittent epigastric and right upper quadrant abdominal pain with associated nausea and vomiting which began about 3 days prior to admission and worsened over one day prior to admission.  She reports a history of chronic intermittent abdominal pain since childhood.  She denies alcohol.   Physical Exam - key components related to admission:  Filed Vitals:   08/15/12 0500 08/15/12 0700 08/15/12 0800 08/15/12 0913  BP: 114/64 101/53 102/55 104/45  Pulse: 69 72 66 72  Temp:    98.4 F (36.9 C)  TempSrc:    Oral  Resp:    18  Height:    5\' 1"  (1.549 m)  Weight:    145 lb (65.772 kg)  SpO2: 96% 98% 98% 100%   General: Alert, no distress Lungs: Clear Heart: Regular; S1-S2, no S3, no S4, no murmurs Abdomen: Bowel sounds present, soft; moderate right upper quadrant and epigastric tenderness; no rebound Extremities: No edema   Lab results:   Basic Metabolic Panel:  Basename 08/15/12 1055 08/15/12 0019  NA -- 136  K -- 3.8  CL -- 100  CO2 -- 26  GLUCOSE -- 159*  BUN -- 11  CREATININE 0.55 0.63  CALCIUM -- 9.9  MG -- --  PHOS -- --   Liver Function Tests:  Basename 08/15/12 0019  AST 84*  ALT 54*  ALKPHOS 92  BILITOT 0.8  PROT 8.0  ALBUMIN 4.3    Basename 08/15/12 0019  LIPASE >3000*  AMYLASE --    CBC:  Basename 08/15/12 1055 08/15/12 0019  WBC 16.1* 21.2*  NEUTROABS -- 19.2*  HGB 12.6  12.9  HCT 38.1 38.5  MCV 87.4 87.1  PLT 290 288    Fasting Lipid Panel:  Basename 08/15/12 0806  CHOL 119  HDL 56  LDLCALC 51  TRIG 61  CHOLHDL 2.1  LDLDIRECT --    Urinalysis    Component Value Date/Time   COLORURINE YELLOW 08/15/2012 0314   APPEARANCEUR CLEAR 08/15/2012 0314   LABSPEC 1.012 08/15/2012 0314   PHURINE 8.5* 08/15/2012 0314   GLUCOSEU NEGATIVE 08/15/2012 0314   HGBUR NEGATIVE 08/15/2012 0314   BILIRUBINUR NEGATIVE 08/15/2012 0314   KETONESUR 15* 08/15/2012 0314   PROTEINUR NEGATIVE 08/15/2012 0314   UROBILINOGEN 0.2 08/15/2012 0314   NITRITE NEGATIVE 08/15/2012 0314   LEUKOCYTESUR TRACE* 08/15/2012 0314    Urine microscopic: WBC 0-2, many squamous epithelial, rare bacteria    Urine pregnancy test: Negative    Imaging results:  US Abdomen Complete  08/15/2012  *RADIOLOGY REPORT*  Clinical Data:  Right upper quadrant abdominal pain, elevated LFTs, leukocytosis and elevated lipase.  ABDOMINAL ULTRASOUND COMPLETE  Comparison:  None  Findings:  Gallbladder: Multiple stones of varying size are noted within the gallbladder, several which are adherent to the gallbladder wall. The gallbladder wall appears thickened, measuring 0.5 cm, but no significant pericholecystic fluid is seen, and no ultrasonographic Murphy's sign is elicited.  Common Bile Duct:  0.5 cm in diameter; within normal limits in caliber.  Liver:  Normal parenchymal echogenicity and echotexture; no focal lesions identified.  Limited Doppler evaluation demonstrates normal blood flow within the liver.  IVC:  Unremarkable in appearance.  Pancreas: There is question of trace fluid about the pancreas, though this is not well characterized on ultrasound.  Given the patient's elevated lipase, pancreatitis is considered likely.  Spleen:  9.4 cm in length; within normal limits in size and echotexture.  Scattered small foci of increased echogenicity within the spleen likely reflect calcified granulomata, measuring up to 3 mm  in size.  Right kidney:  10.2 cm in length; normal in size, configuration and parenchymal echogenicity.  No evidence of mass or hydronephrosis.  Left kidney:  11.0 cm in length; normal in size, configuration and parenchymal echogenicity.  No evidence of mass or hydronephrosis.  Abdominal Aorta:  Normal in caliber; no aneurysm identified.  IMPRESSION:  1.  Question of trace fluid about the pancreas.  Given the patient's elevated lipase, pancreatitis is considered likely. 2.  Cholelithiasis; apparent thickening of the gallbladder wall to 0.5 cm, without additional evidence to suggest cholecystitis.  No ultrasonographic Murphy's sign seen. 3.  Scattered calcified granulomata within the spleen.   Original Report Authenticated By: Tonia Ghent, M.D.    Ct Abdomen Pelvis W Contrast  08/15/2012  *RADIOLOGY REPORT*  Clinical Data: Abdominal pain, radiating to the back; nausea and vomiting.  CT ABDOMEN AND PELVIS WITH CONTRAST  Technique:  Multidetector CT imaging of the abdomen and pelvis was performed following the standard protocol during bolus administration of intravenous contrast.  Contrast: OMNIPAQUE IOHEXOL 300 MG/ML  SOLN  Comparison: Abdominal ultrasound performed earlier today at 01:44 a.m.  Findings: The visualized lung bases are clear.  There is fluid noted tracking about the entirety of the pancreas, extending inferiorly along Gerota's fascia bilaterally, more prominent on the left.  Associated soft tissue inflammation is noted.  Findings are compatible with acute pancreatitis.  There is no definite evidence of devascularization or pseudocyst formation, though the pancreas is difficult to fully assess due to motion artifact.  A small amount of pericholecystic fluid is noted about the gallbladder.  This may be reactive in nature.  Known gallstones are not well characterized on CT.  There is no evidence of common hepatic duct dilatation to suggest obstruction.  The adrenal glands are unremarkable in  appearance.  The spleen is within normal limits.  The liver is grossly unremarkable in appearance.  The kidneys are unremarkable in appearance.  There is no evidence of hydronephrosis.  No renal or ureteral stones are seen.  No perinephric stranding is appreciated.  No free fluid is identified.  The small bowel is unremarkable in appearance.  The stomach is within normal limits.  No acute vascular abnormalities are seen.  The appendix is not definitely seen; there is no evidence for appendicitis.  The colon is grossly unremarkable in appearance.  The bladder is moderately distended and grossly normal in appearance.  The patient's intrauterine device is noted in abnormally low position, with one of the prongs apparently extending into the myometrium.  The uterus is otherwise unremarkable in appearance.  The ovaries are relatively symmetric, though difficult to fully characterize; no suspicious adnexal masses are seen.  No inguinal lymphadenopathy is seen.  No acute osseous abnormalities are identified.  IMPRESSION:  1.  Acute pancreatitis noted, with fluid tracking about the entirety of the pancreas, and extending inferiorly along Gerota's fascia bilaterally, more prominent on the left.  Associated soft tissue inflammation seen.  No definite evidence of devascularization or pseudocyst formation, though evaluation is mildly suboptimal due to motion artifact. 2.  Small amount of pericholecystic fluid about the gallbladder may be reactive in nature; known cholelithiasis not well characterized on CT.  No evidence to suggest obstruction. 3.  Intrauterine device noted in abnormally low position in the uterus, with one of the prongs apparently extending into the myometrium.  Recommend clinical correlation and adjustment.   Original Report Authenticated By: Tonia Ghent, M.D.     Assessment & Plan by Problem:  1.  Acute pancreatitis.  This was likely caused by a gallstone, which may have passed.  Plan is n.p.o.; IV  fluids; pain control; antiemetics; supportive care; GI consult regarding best approach to management.   2.  Other plans as per resident physician's note.

## 2012-08-16 DIAGNOSIS — K859 Acute pancreatitis without necrosis or infection, unspecified: Secondary | ICD-10-CM

## 2012-08-16 DIAGNOSIS — K8010 Calculus of gallbladder with chronic cholecystitis without obstruction: Secondary | ICD-10-CM

## 2012-08-16 LAB — HEPATITIS PANEL, ACUTE: Hep A IgM: NEGATIVE

## 2012-08-16 LAB — HIV ANTIBODY (ROUTINE TESTING W REFLEX): HIV: NONREACTIVE

## 2012-08-16 LAB — COMPREHENSIVE METABOLIC PANEL
ALT: 32 U/L (ref 0–35)
AST: 32 U/L (ref 0–37)
Albumin: 3.2 g/dL — ABNORMAL LOW (ref 3.5–5.2)
CO2: 18 mEq/L — ABNORMAL LOW (ref 19–32)
Calcium: 8.5 mg/dL (ref 8.4–10.5)
Chloride: 106 mEq/L (ref 96–112)
GFR calc non Af Amer: >90 mL/min (ref >90)
Sodium: 139 mEq/L (ref 135–145)

## 2012-08-16 LAB — CBC
Hemoglobin: 11.5 g/dL — ABNORMAL LOW (ref 12.0–15.0)
MCH: 28.3 pg (ref 26.0–34.0)
MCHC: 31.7 g/dL (ref 30.0–36.0)
MCV: 89.2 fL (ref 78.0–100.0)

## 2012-08-16 LAB — LIPASE, BLOOD: Lipase: 598 U/L — ABNORMAL HIGH (ref 11–59)

## 2012-08-16 MED ORDER — FENTANYL CITRATE 0.05 MG/ML IJ SOLN
12.5000 ug | INTRAMUSCULAR | Status: DC | PRN
  Administered 2012-08-16: 25 ug via INTRAVENOUS
  Filled 2012-08-16: qty 2

## 2012-08-16 MED ORDER — BIOTENE DRY MOUTH MT LIQD
15.0000 mL | Freq: Two times a day (BID) | OROMUCOSAL | Status: DC
  Administered 2012-08-16 – 2012-08-19 (×5): 15 mL via OROMUCOSAL

## 2012-08-16 MED ORDER — FENTANYL CITRATE 0.05 MG/ML IJ SOLN: 25.0000 ug | INTRAMUSCULAR | Status: DC | PRN

## 2012-08-16 NOTE — H&P (Signed)
Mary Mcintosh [birth year]  [id number].   Primary Care MD: NO PCP on file Requesting MD: Melvia Heaps, MD [facility].   Chief Complaint/Reason for Consult: Epigastric pain, intractable nausea and vomiting. HPI: 21 y/o with not known past medical history that is admitted with intense epigastric abdominal pain and intractable n/v. She reports having intermittent episodes of abdominal pain for the past two months that exacerbate with coffee, or greasy foods. 24 h prior admission pain was 8/10 epigastric with radiation to the back and associated with n/v. Denies fever, but reports had chills. She states is usually constipated and has not had a BM in two days. Denies recent use of alcohol.    ROS:  Pt denies SOB, chest pain, palpitations, headaches, dizziness, numbness or weakness. No frequency or dysuria . No unintentional weigh loss/gain.   History reviewed. No pertinent family history.  Past Medical History  Diagnosis Date  . Pancreatitis 08/15/2012  . Pre-eclampsia 2012    with last child   . Varicose veins of both lower extremities with pain     "when I work" (08/15/2012)  . Swelling of lower limb     "both legs; when I work" (08/15/2012)  . Migraines     "I think they're migraines, I get them often" (08/15/2012)  . Anxiety ~ 2009    "once; only for a short time" (08/15/2012)  . Depression ~ 2009    "once; only for a short time" (08/15/2012)    Past Surgical History  Procedure Date  . No past surgeries     Social History:  reports that she has quit smoking. Her smoking use included Cigarettes. She has never used smokeless tobacco. She reports that she drinks alcohol but only occassional. She reports that she does not use illicit drugs. Works in [employer]. Lives with partner and her 1 y/o son.   Allergies: No Known Allergies  No prescriptions prior to admission   Blood pressure 102/51, pulse 81, temperature 98.5 F (36.9 C), temperature source Oral, resp. rate 18, height  5\' 1"  (1.549 m), weight 144 lb 13.5 oz (65.7 kg), last menstrual period 07/29/2012, SpO2 100.00%.  Physical Exam: Gen:  NAD HEENT: Moist mucous membranes  CV: Regular rate and rhythm, no murmurs rubs or gallops PULM: Clear to auscultation bilaterally. No wheezes/rales/rhonchi ABD: Soft,  Tender to palpation on epigastric area, non distended, normal bowel sounds EXT: No edema Neuro: Alert and oriented x3. No focalization   Results for orders placed during the hospital encounter of 08/14/12 (from the past 48 hour(s))  COMPREHENSIVE METABOLIC PANEL     Status: Abnormal   Collection Time   08/15/12 12:19 AM      Component Value Range Comment   Sodium 136  135 - 145 mEq/L    Potassium 3.8  3.5 - 5.1 mEq/L    Chloride 100  96 - 112 mEq/L    CO2 26  19 - 32 mEq/L    Glucose, Bld 159 (*) 70 - 99 mg/dL    BUN 11  6 - 23 mg/dL    Creatinine, Ser 4.09  0.50 - 1.10 mg/dL    Calcium 9.9  8.4 - 81.1 mg/dL    Total Protein 8.0  6.0 - 8.3 g/dL    Albumin 4.3  3.5 - 5.2 g/dL    AST 84 (*) 0 - 37 U/L    ALT 54 (*) 0 - 35 U/L    Alkaline Phosphatase 92  39 - 117 U/L  Total Bilirubin 0.8  0.3 - 1.2 mg/dL    GFR calc non Af Amer >90  >90 mL/min    GFR calc Af Amer >90  >90 mL/min   CBC WITH DIFFERENTIAL     Status: Abnormal   Collection Time   08/15/12 12:19 AM      Component Value Range Comment   WBC 21.2 (*) 4.0 - 10.5 K/uL    RBC 4.42  3.87 - 5.11 MIL/uL    Hemoglobin 12.9  12.0 - 15.0 g/dL    HCT 40.9  81.1 - 91.4 %    MCV 87.1  78.0 - 100.0 fL    MCH 29.2  26.0 - 34.0 pg    MCHC 33.5  30.0 - 36.0 g/dL    RDW 78.2  95.6 - 21.3 %    Platelets 288  150 - 400 K/uL    Neutrophils Relative 90 (*) 43 - 77 %    Neutro Abs 19.2 (*) 1.7 - 7.7 K/uL    Lymphocytes Relative 6 (*) 12 - 46 %    Lymphs Abs 1.3  0.7 - 4.0 K/uL    Monocytes Relative 3  3 - 12 %    Monocytes Absolute 0.7  0.1 - 1.0 K/uL    Eosinophils Relative 0  0 - 5 %    Eosinophils Absolute 0.0  0.0 - 0.7 K/uL    Basophils  Relative 0  0 - 1 %    Basophils Absolute 0.0  0.0 - 0.1 K/uL   LIPASE, BLOOD     Status: Abnormal   Collection Time   08/15/12 12:19 AM      Component Value Range Comment   Lipase >3000 (*) 11 - 59 U/L RESULT CONFIRMED BY AUTOMATED DILUTION  URINALYSIS, ROUTINE W REFLEX MICROSCOPIC     Status: Abnormal   Collection Time   08/15/12  3:14 AM      Component Value Range Comment   Color, Urine YELLOW  YELLOW    APPearance CLEAR  CLEAR    Specific Gravity, Urine 1.012  1.005 - 1.030    pH 8.5 (*) 5.0 - 8.0    Glucose, UA NEGATIVE  NEGATIVE mg/dL    Hgb urine dipstick NEGATIVE  NEGATIVE    Bilirubin Urine NEGATIVE  NEGATIVE    Ketones, ur 15 (*) NEGATIVE mg/dL    Protein, ur NEGATIVE  NEGATIVE mg/dL    Urobilinogen, UA 0.2  0.0 - 1.0 mg/dL    Nitrite NEGATIVE  NEGATIVE    Leukocytes, UA TRACE (*) NEGATIVE   PREGNANCY, URINE     Status: Normal   Collection Time   08/15/12  3:14 AM      Component Value Range Comment   Preg Test, Ur NEGATIVE  NEGATIVE   URINE MICROSCOPIC-ADD ON     Status: Abnormal   Collection Time   08/15/12  3:14 AM      Component Value Range Comment   Squamous Epithelial / LPF MANY (*) RARE    WBC, UA 0-2  <3 WBC/hpf    Bacteria, UA RARE  RARE   LIPID PANEL     Status: Normal   Collection Time   08/15/12  8:06 AM      Component Value Range Comment   Cholesterol 119  0 - 200 mg/dL    Triglycerides 61  <086 mg/dL    HDL 56  >57 mg/dL    Total CHOL/HDL Ratio 2.1      VLDL 12  0 -  40 mg/dL    LDL Cholesterol 51  0 - 99 mg/dL   CBC     Status: Abnormal   Collection Time   08/15/12 10:55 AM      Component Value Range Comment   WBC 16.1 (*) 4.0 - 10.5 K/uL    RBC 4.36  3.87 - 5.11 MIL/uL    Hemoglobin 12.6  12.0 - 15.0 g/dL    HCT 16.1  09.6 - 04.5 %    MCV 87.4  78.0 - 100.0 fL    MCH 28.9  26.0 - 34.0 pg    MCHC 33.1  30.0 - 36.0 g/dL    RDW 40.9  81.1 - 91.4 %    Platelets 290  150 - 400 K/uL   CREATININE, SERUM     Status: Normal   Collection Time    08/15/12 10:55 AM      Component Value Range Comment   Creatinine, Ser 0.55  0.50 - 1.10 mg/dL    GFR calc non Af Amer >90  >90 mL/min    GFR calc Af Amer >90  >90 mL/min   HIV ANTIBODY (ROUTINE TESTING)     Status: Normal   Collection Time   08/15/12  3:09 PM      Component Value Range Comment   HIV NON REACTIVE  NON REACTIVE   COMPREHENSIVE METABOLIC PANEL     Status: Abnormal   Collection Time   08/16/12  5:55 AM      Component Value Range Comment   Sodium 139  135 - 145 mEq/L    Potassium 3.8  3.5 - 5.1 mEq/L HEMOLYSIS AT THIS LEVEL MAY AFFECT RESULT   Chloride 106  96 - 112 mEq/L    CO2 18 (*) 19 - 32 mEq/L    Glucose, Bld 55 (*) 70 - 99 mg/dL    BUN 9  6 - 23 mg/dL    Creatinine, Ser 7.82  0.50 - 1.10 mg/dL    Calcium 8.5  8.4 - 95.6 mg/dL    Total Protein 6.6  6.0 - 8.3 g/dL    Albumin 3.2 (*) 3.5 - 5.2 g/dL    AST 32  0 - 37 U/L    ALT 32  0 - 35 U/L    Alkaline Phosphatase 81  39 - 117 U/L    Total Bilirubin 0.6  0.3 - 1.2 mg/dL    GFR calc non Af Amer >90  >90 mL/min    GFR calc Af Amer >90  >90 mL/min   CBC     Status: Abnormal   Collection Time   08/16/12  5:55 AM      Component Value Range Comment   WBC 12.9 (*) 4.0 - 10.5 K/uL    RBC 4.07  3.87 - 5.11 MIL/uL    Hemoglobin 11.5 (*) 12.0 - 15.0 g/dL    HCT 21.3  08.6 - 57.8 %    MCV 89.2  78.0 - 100.0 fL    MCH 28.3  26.0 - 34.0 pg    MCHC 31.7  30.0 - 36.0 g/dL    RDW 46.9  62.9 - 52.8 %    Platelets 242  150 - 400 K/uL   LIPASE, BLOOD     Status: Abnormal   Collection Time   08/16/12  5:55 AM      Component Value Range Comment   Lipase 598 (*) 11 - 59 U/L    US Abdomen Complete  08/15/2012   IMPRESSION:  1.  Question of trace fluid about the pancreas.  Given the patient's elevated lipase, pancreatitis is considered likely. 2.  Cholelithiasis; apparent thickening of the gallbladder wall to 0.5 cm, without additional evidence to suggest cholecystitis.  No ultrasonographic Murphy's sign seen. 3.  Scattered  calcified granulomata within the spleen.   Original Report Authenticated By: Tonia Ghent, M.D.    Ct Abdomen Pelvis W Contrast  08/15/2012    IMPRESSION:  1.  Acute pancreatitis noted, with fluid tracking about the entirety of the pancreas, and extending inferiorly along Gerota's fascia bilaterally, more prominent on the left.  Associated soft tissue inflammation seen.  No definite evidence of devascularization or pseudocyst formation, though evaluation is mildly suboptimal due to motion artifact. 2.  Small amount of pericholecystic fluid about the gallbladder may be reactive in nature; known cholelithiasis not well characterized on CT.  No evidence to suggest obstruction. 3.  Intrauterine device noted in abnormally low position in the uterus, with one of the prongs apparently extending into the myometrium.  Recommend clinical correlation and adjustment.   Original Report Authenticated By: Tonia Ghent, M.D.    Assessment/Plan 21 y/o F with Acute Pancreatitis possible due to Cholelithiasis. - NPO, Hydration. 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 - Possible surgery when Lipase returns to normal. We will discuss with surgery team.   PILOTO, Raylyn Speckman 08/16/2012, 11:11 AM

## 2012-08-16 NOTE — Progress Notes (Signed)
Resident Addendum to Medical Student Note   I have seen and examined the patient, and agree with the the medical student assessment and plan outlined above. Please see my separate note for additional details.   Length of Stay: 2   Kristie Cowman, MD PGY2, Internal Medicine Resident 08/16/2012, 3:45 PM

## 2012-08-16 NOTE — Progress Notes (Signed)
Chart was reviewed and patient was examined. X-rays were reviewed.    I agree with management and plans.  Robert D. Kaplan, M.D., FACG [facility] Cell [phone]  

## 2012-08-16 NOTE — Progress Notes (Signed)
Subjective: No acute events overnight, reports near resolution of pain  Objective: Vital signs in last 24 hours: Filed Vitals:   08/15/12 1910 08/15/12 2345 08/16/12 0150 08/16/12 0630  BP: 95/44 108/47 100/49 102/51  Pulse: 79 86 86 81  Temp: 98.4 F (36.9 C) 98.4 F (36.9 C) 98.6 F (37 C) 98.5 F (36.9 C)  TempSrc: Oral Oral Oral Oral  Resp: 18 19 18 18   Height:      Weight:    144 lb 13.5 oz (65.7 kg)  SpO2: 100% 98% 99% 100%   Weight change:   Intake/Output Summary (Last 24 hours) at 08/16/12 1244 Last data filed at 08/16/12 1100  Gross per 24 hour  Intake 3783.33 ml  Output    950 ml  Net 2833.33 ml   Physical Exam: General: Well-developed, well-nourished, in no acute distress; pleasant Lungs: Normal respiratory effort. Clear to auscultation bilaterally from apices to bases without crackles or wheezes appreciated. Heart: normal rate, regular rhythm, normal S1 and S2, no gallop, murmur, or rubs appreciated. Abdomen: BS normoactive. Soft, Nondistended, mildly tender Extremities: No pretibial edema, distal pulses intact Neurologic: grossly non-focal, alert and oriented x3, appropriate and cooperative throughout examination.   Lab Results: Basic Metabolic Panel:  Lab 08/16/12 1610 08/15/12 1055 08/15/12 0019  NA 139 -- 136  K 3.8 -- 3.8  CL 106 -- 100  CO2 18* -- 26  GLUCOSE 55* -- 159*  BUN 9 -- 11  CREATININE 0.53 0.55 --  CALCIUM 8.5 -- 9.9  MG -- -- --  PHOS -- -- --   Liver Function Tests:  Lab 08/16/12 0555 08/15/12 0019  AST 32 84*  ALT 32 54*  ALKPHOS 81 92  BILITOT 0.6 0.8  PROT 6.6 8.0  ALBUMIN 3.2* 4.3    Lab 08/16/12 0555 08/15/12 0019  LIPASE 598* >3000*  AMYLASE -- --   CBC:  Lab 08/16/12 0555 08/15/12 1055 08/15/12 0019  WBC 12.9* 16.1* --  NEUTROABS -- -- 19.2*  HGB 11.5* 12.6 --  HCT 36.3 38.1 --  MCV 89.2 87.4 --  PLT 242 290 --   Fasting Lipid Panel:  Lab 08/15/12 0806  CHOL 119  HDL 56  LDLCALC 51  TRIG 61    CHOLHDL 2.1  LDLDIRECT --   Urinalysis:  Lab 08/15/12 0314  COLORURINE YELLOW  LABSPEC 1.012  PHURINE 8.5*  GLUCOSEU NEGATIVE  HGBUR NEGATIVE  BILIRUBINUR NEGATIVE  KETONESUR 15*  PROTEINUR NEGATIVE  UROBILINOGEN 0.2  NITRITE NEGATIVE  LEUKOCYTESUR TRACE*    Micro Results: No results found for this or any previous visit (from the past 240 hour(s)). Studies/Results: US Abdomen Complete  08/15/2012  *RADIOLOGY REPORT*  Clinical Data:  Right upper quadrant abdominal pain, elevated LFTs, leukocytosis and elevated lipase.  ABDOMINAL ULTRASOUND COMPLETE  Comparison:  None  Findings:  Gallbladder: Multiple stones of varying size are noted within the gallbladder, several which are adherent to the gallbladder wall. The gallbladder wall appears thickened, measuring 0.5 cm, but no significant pericholecystic fluid is seen, and no ultrasonographic Murphy's sign is elicited.  Common Bile Duct:  0.5 cm in diameter; within normal limits in caliber.  Liver:  Normal parenchymal echogenicity and echotexture; no focal lesions identified.  Limited Doppler evaluation demonstrates normal blood flow within the liver.  IVC:  Unremarkable in appearance.  Pancreas: There is question of trace fluid about the pancreas, though this is not well characterized on ultrasound.  Given the patient's elevated lipase, pancreatitis is considered likely.  Spleen:  9.4 cm in length; within normal limits in size and echotexture.  Scattered small foci of increased echogenicity within the spleen likely reflect calcified granulomata, measuring up to 3 mm in size.  Right kidney:  10.2 cm in length; normal in size, configuration and parenchymal echogenicity.  No evidence of mass or hydronephrosis.  Left kidney:  11.0 cm in length; normal in size, configuration and parenchymal echogenicity.  No evidence of mass or hydronephrosis.  Abdominal Aorta:  Normal in caliber; no aneurysm identified.  IMPRESSION:  1.  Question of trace fluid about  the pancreas.  Given the patient's elevated lipase, pancreatitis is considered likely. 2.  Cholelithiasis; apparent thickening of the gallbladder wall to 0.5 cm, without additional evidence to suggest cholecystitis.  No ultrasonographic Murphy's sign seen. 3.  Scattered calcified granulomata within the spleen.   Original Report Authenticated By: Tonia Ghent, M.D.    Ct Abdomen Pelvis W Contrast  08/15/2012  *RADIOLOGY REPORT*  Clinical Data: Abdominal pain, radiating to the back; nausea and vomiting.  CT ABDOMEN AND PELVIS WITH CONTRAST  Technique:  Multidetector CT imaging of the abdomen and pelvis was performed following the standard protocol during bolus administration of intravenous contrast.  Contrast: OMNIPAQUE IOHEXOL 300 MG/ML  SOLN  Comparison: Abdominal ultrasound performed earlier today at 01:44 a.m.  Findings: The visualized lung bases are clear.  There is fluid noted tracking about the entirety of the pancreas, extending inferiorly along Gerota's fascia bilaterally, more prominent on the left.  Associated soft tissue inflammation is noted.  Findings are compatible with acute pancreatitis.  There is no definite evidence of devascularization or pseudocyst formation, though the pancreas is difficult to fully assess due to motion artifact.  A small amount of pericholecystic fluid is noted about the gallbladder.  This may be reactive in nature.  Known gallstones are not well characterized on CT.  There is no evidence of common hepatic duct dilatation to suggest obstruction.  The adrenal glands are unremarkable in appearance.  The spleen is within normal limits.  The liver is grossly unremarkable in appearance.  The kidneys are unremarkable in appearance.  There is no evidence of hydronephrosis.  No renal or ureteral stones are seen.  No perinephric stranding is appreciated.  No free fluid is identified.  The small bowel is unremarkable in appearance.  The stomach is within normal limits.  No acute  vascular abnormalities are seen.  The appendix is not definitely seen; there is no evidence for appendicitis.  The colon is grossly unremarkable in appearance.  The bladder is moderately distended and grossly normal in appearance.  The patient's intrauterine device is noted in abnormally low position, with one of the prongs apparently extending into the myometrium.  The uterus is otherwise unremarkable in appearance.  The ovaries are relatively symmetric, though difficult to fully characterize; no suspicious adnexal masses are seen.  No inguinal lymphadenopathy is seen.  No acute osseous abnormalities are identified.  IMPRESSION:  1.  Acute pancreatitis noted, with fluid tracking about the entirety of the pancreas, and extending inferiorly along Gerota's fascia bilaterally, more prominent on the left.  Associated soft tissue inflammation seen.  No definite evidence of devascularization or pseudocyst formation, though evaluation is mildly suboptimal due to motion artifact. 2.  Small amount of pericholecystic fluid about the gallbladder may be reactive in nature; known cholelithiasis not well characterized on CT.  No evidence to suggest obstruction. 3.  Intrauterine device noted in abnormally low position in the uterus, with one of  the prongs apparently extending into the myometrium.  Recommend clinical correlation and adjustment.   Original Report Authenticated By: Tonia Ghent, M.D.    Medications: I have reviewed the patient's current medications. Scheduled Meds:   . enoxaparin (LOVENOX) injection  40 mg Subcutaneous Q24H  . [COMPLETED] influenza  inactive virus vaccine  0.5 mL Intramuscular Tomorrow-1000   Continuous Infusions:   . sodium chloride 1,000 mL (08/16/12 0741)   Followed by  . sodium chloride     PRN Meds:.fentaNYL, ondansetron (ZOFRAN) IV, ondansetron, [DISCONTINUED] fentaNYL, [DISCONTINUED]  morphine injection Assessment/Plan:  1.Pancreatitis: likely secondary to gallstone, acute  episode of pain subsided with bowel rest and aggressive IV fluids, will advance diet to clears then as tolerated.  Gi consulted and recommending outpt cholecystectomy -Surgery consulted for inpt consultation for procedure after discharge    LOS: 2 days   Mary Mcintosh 08/16/2012, 12:44 PM

## 2012-08-16 NOTE — Progress Notes (Signed)
Medical Student Daily Progress Note   Subjective:    Interval Events:  Patient doing well. Reports epigastric pain much better today, still has some occasisal epigastric discomfort.  Denies any emesis, denies nausea.    Objective:    Vital Signs:   Temp:  [98.4 F (36.9 C)-98.8 F (37.1 C)] 98.5 F (36.9 C) (11/07 0630) Pulse Rate:  [72-86] 81  (11/07 0630) Resp:  [18-19] 18  (11/07 0630) BP: (95-119)/(44-72) 102/51 mmHg (11/07 0630) SpO2:  [98 %-100 %] 100 % (11/07 0630) Weight:  [65.7 kg (144 lb 13.5 oz)] 65.7 kg (144 lb 13.5 oz) (11/07 0630) Last BM Date: 08/14/12   Intake/Output:   Intake/Output Summary (Last 24 hours) at 08/16/12 1349 Last data filed at 08/16/12 1100  Gross per 24 hour  Intake 3783.33 ml  Output    950 ml  Net 2833.33 ml       Physical Exam: GENERAL:  alert and oriented; resting comfortably in chair and in no distress EYES:  pupils equal, round, and reactive to light; sclera anicteric ENT:  moist mucosa LUNGS:  clear to auscultation bilaterally, normal work of breathing HEART:  normal rate; regular rhythm; normal S1 and S2, no S3 or S4 appreciated; no murmurs, rubs, or clicks ABDOMEN:  Soft, mild tenderness to deep palpitation in RUQ, normal bowel sounds, no masses palpated, EXTREMITIES:  no edema SKIN:  normal turgor    Labs: Basic Metabolic Panel:  Lab 08/16/12 2956 08/15/12 1055 08/15/12 0019  NA 139 -- 136  K 3.8 -- 3.8  CL 106 -- 100  CO2 18* -- 26  GLUCOSE 55* -- 159*  BUN 9 -- 11  CREATININE 0.53 0.55 0.63  CALCIUM 8.5 -- 9.9  MG -- -- --  PHOS -- -- --    Liver Function Tests:  Lab 08/16/12 0555 08/15/12 0019  AST 32 84*  ALT 32 54*  ALKPHOS 81 92  BILITOT 0.6 0.8  PROT 6.6 8.0  ALBUMIN 3.2* 4.3    Lab 08/16/12 0555 08/15/12 0019  LIPASE 598* >3000*  AMYLASE -- --   No results found for this basename: AMMONIA:3 in the last 168 hours  CBC:  Lab 08/16/12 0555 08/15/12 1055 08/15/12 0019  WBC 12.9* 16.1* 21.2*   NEUTROABS -- -- 19.2*  HGB 11.5* 12.6 12.9  HCT 36.3 38.1 38.5  MCV 89.2 87.4 87.1  PLT 242 290 288    Cardiac Enzymes: No results found for this basename: CKTOTAL:5,CKMB:5,CKMBINDEX:5,TROPONINI:5 in the last 168 hours  BNP (last 3 results): No results found for this basename: PROBNP:3 in the last 8760 hours  CBG: No results found for this basename: GLUCAP:5 in the last 168 hours  Coagulation Studies: No results found for this basename: LABPROT:5,INR:5 in the last 72 hours  Microbiology: Results for orders placed during the hospital encounter of 03/09/11  MRSA PCR SCREENING     Status: Normal   Collection Time   03/10/11  1:31 PM      Component Value Range Status Comment   MRSA by PCR    NEGATIVE Final    Value: NEGATIVE            The GeneXpert MRSA Assay (FDA     approved for NASAL specimens     only), is one component of a     comprehensive MRSA colonization     surveillance program. It is not     intended to diagnose MRSA     infection nor to guide or  monitor treatment for     MRSA infections.    Imaging: US Abdomen Complete  08/15/2012  *RADIOLOGY REPORT*  Clinical Data:  Right upper quadrant abdominal pain, elevated LFTs, leukocytosis and elevated lipase.  ABDOMINAL ULTRASOUND COMPLETE  Comparison:  None  Findings:  Gallbladder: Multiple stones of varying size are noted within the gallbladder, several which are adherent to the gallbladder wall. The gallbladder wall appears thickened, measuring 0.5 cm, but no significant pericholecystic fluid is seen, and no ultrasonographic Murphy's sign is elicited.  Common Bile Duct:  0.5 cm in diameter; within normal limits in caliber.  Liver:  Normal parenchymal echogenicity and echotexture; no focal lesions identified.  Limited Doppler evaluation demonstrates normal blood flow within the liver.  IVC:  Unremarkable in appearance.  Pancreas: There is question of trace fluid about the pancreas, though this is not well  characterized on ultrasound.  Given the patient's elevated lipase, pancreatitis is considered likely.  Spleen:  9.4 cm in length; within normal limits in size and echotexture.  Scattered small foci of increased echogenicity within the spleen likely reflect calcified granulomata, measuring up to 3 mm in size.  Right kidney:  10.2 cm in length; normal in size, configuration and parenchymal echogenicity.  No evidence of mass or hydronephrosis.  Left kidney:  11.0 cm in length; normal in size, configuration and parenchymal echogenicity.  No evidence of mass or hydronephrosis.  Abdominal Aorta:  Normal in caliber; no aneurysm identified.  IMPRESSION:  1.  Question of trace fluid about the pancreas.  Given the patient's elevated lipase, pancreatitis is considered likely. 2.  Cholelithiasis; apparent thickening of the gallbladder wall to 0.5 cm, without additional evidence to suggest cholecystitis.  No ultrasonographic Murphy's sign seen. 3.  Scattered calcified granulomata within the spleen.   Original Report Authenticated By: Tonia Ghent, M.D.    Ct Abdomen Pelvis W Contrast  08/15/2012  *RADIOLOGY REPORT*  Clinical Data: Abdominal pain, radiating to the back; nausea and vomiting.  CT ABDOMEN AND PELVIS WITH CONTRAST  Technique:  Multidetector CT imaging of the abdomen and pelvis was performed following the standard protocol during bolus administration of intravenous contrast.  Contrast: OMNIPAQUE IOHEXOL 300 MG/ML  SOLN  Comparison: Abdominal ultrasound performed earlier today at 01:44 a.m.  Findings: The visualized lung bases are clear.  There is fluid noted tracking about the entirety of the pancreas, extending inferiorly along Gerota's fascia bilaterally, more prominent on the left.  Associated soft tissue inflammation is noted.  Findings are compatible with acute pancreatitis.  There is no definite evidence of devascularization or pseudocyst formation, though the pancreas is difficult to fully assess due  to motion artifact.  A small amount of pericholecystic fluid is noted about the gallbladder.  This may be reactive in nature.  Known gallstones are not well characterized on CT.  There is no evidence of common hepatic duct dilatation to suggest obstruction.  The adrenal glands are unremarkable in appearance.  The spleen is within normal limits.  The liver is grossly unremarkable in appearance.  The kidneys are unremarkable in appearance.  There is no evidence of hydronephrosis.  No renal or ureteral stones are seen.  No perinephric stranding is appreciated.  No free fluid is identified.  The small bowel is unremarkable in appearance.  The stomach is within normal limits.  No acute vascular abnormalities are seen.  The appendix is not definitely seen; there is no evidence for appendicitis.  The colon is grossly unremarkable in appearance.  The bladder  is moderately distended and grossly normal in appearance.  The patient's intrauterine device is noted in abnormally low position, with one of the prongs apparently extending into the myometrium.  The uterus is otherwise unremarkable in appearance.  The ovaries are relatively symmetric, though difficult to fully characterize; no suspicious adnexal masses are seen.  No inguinal lymphadenopathy is seen.  No acute osseous abnormalities are identified.  IMPRESSION:  1.  Acute pancreatitis noted, with fluid tracking about the entirety of the pancreas, and extending inferiorly along Gerota's fascia bilaterally, more prominent on the left.  Associated soft tissue inflammation seen.  No definite evidence of devascularization or pseudocyst formation, though evaluation is mildly suboptimal due to motion artifact. 2.  Small amount of pericholecystic fluid about the gallbladder may be reactive in nature; known cholelithiasis not well characterized on CT.  No evidence to suggest obstruction. 3.  Intrauterine device noted in abnormally low position in the uterus, with one of the  prongs apparently extending into the myometrium.  Recommend clinical correlation and adjustment.   Original Report Authenticated By: Tonia Ghent, M.D.       Medications:    Infusions:    . sodium chloride 1,000 mL (08/16/12 1311)   Followed by  . sodium chloride       Scheduled Medications:    . enoxaparin (LOVENOX) injection  40 mg Subcutaneous Q24H  . [COMPLETED] influenza  inactive virus vaccine  0.5 mL Intramuscular Tomorrow-1000     PRN Medications: fentaNYL, ondansetron (ZOFRAN) IV, ondansetron, [DISCONTINUED] fentaNYL, [DISCONTINUED]  morphine injection    Assessment/ Plan:    21 y/o female with PMHx of preeclampsia was admitted with nausea, vomiting, and abdominal pain for 3 days, lipase was found to be 3000, and imaging revealed acute pancreatitis without obstruction. 1. Acute pancreatitis: likely secondary to passed gallstone.  No evidence for current biliary obstruction.  Pt still with mild epigastric discomfort.  Advance diet to clears.  LFTs WNL, Lipase trending down.  Surgery consulted for possible cholecystectomy.  Surgery recommends change of pain management from morphine to fentanyl. Per GI will decrease IVF to 150cc/hr.  2. IUD in abnormally low position on CT- pt does not know who placed IUD, will give patient an outpatient referral with the [facility] clinic for adjustment.   DVT PPX: low molecular weight heparin Full Code   Length of Stay: 2 days   This is a Psychologist, occupational Note.  The care of the patient was discussed with Dr. Bosie Clos and the assessment and plan formulated with their assistance.  Please see their attached note or addendum for official documentation of the daily encounter.

## 2012-08-16 NOTE — Progress Notes (Signed)
[facility] Progress Note  SUBJECTIVE: still having some epigastric discomfort. Trying to take liquid lunch right now.   OBJECTIVE:  Vital signs in last 24 hours: Temp:  [98.4 F (36.9 C)-98.8 F (37.1 C)] 98.5 F (36.9 C) (11/07 0630) Pulse Rate:  [72-86] 81  (11/07 0630) Resp:  [18-19] 18  (11/07 0630) BP: (95-119)/(44-72) 102/51 mmHg (11/07 0630) SpO2:  [98 %-100 %] 100 % (11/07 0630) Weight:  [144 lb 13.5 oz (65.7 kg)] 144 lb 13.5 oz (65.7 kg) (11/07 0630) Last BM Date: 08/14/12 General:    Pleasant female in NAD Lungs: Respirations even and unlabored, lungs CTA bilaterally Abdomen:  Soft,  Nondistended, mild epigastric tenderness. Normal bowel sounds. Neurologic:  Alert and oriented,  grossly normal neurologically. Psych:  Cooperative. Normal mood and affect.     Lab Results:  Basename 08/16/12 0555 08/15/12 1055 08/15/12 0019  WBC 12.9* 16.1* 21.2*  HGB 11.5* 12.6 12.9  HCT 36.3 38.1 38.5  PLT 242 290 288   BMET  Basename 08/16/12 0555 08/15/12 1055 08/15/12 0019  NA 139 -- 136  K 3.8 -- 3.8  CL 106 -- 100  CO2 18* -- 26  GLUCOSE 55* -- 159*  BUN 9 -- 11  CREATININE 0.53 0.55 0.63  CALCIUM 8.5 -- 9.9   LFT  Basename 08/16/12 0555  PROT 6.6  ALBUMIN 3.2*  AST 32  ALT 32  ALKPHOS 81  BILITOT 0.6  BILIDIR --  IBILI --   Studies/Results: US Abdomen Complete  08/15/2012  *RADIOLOGY REPORT*  Clinical Data:  Right upper quadrant abdominal pain, elevated LFTs, leukocytosis and elevated lipase.  ABDOMINAL ULTRASOUND COMPLETE  Comparison:  None  Findings:  Gallbladder: Multiple stones of varying size are noted within the gallbladder, several which are adherent to the gallbladder wall. The gallbladder wall appears thickened, measuring 0.5 cm, but no significant pericholecystic fluid is seen, and no ultrasonographic Murphy's sign is elicited.  Common Bile Duct:  0.5 cm in diameter; within normal limits in caliber.  Liver:  Normal parenchymal  echogenicity and echotexture; no focal lesions identified.  Limited Doppler evaluation demonstrates normal blood flow within the liver.  IVC:  Unremarkable in appearance.  Pancreas: There is question of trace fluid about the pancreas, though this is not well characterized on ultrasound.  Given the patient's elevated lipase, pancreatitis is considered likely.  Spleen:  9.4 cm in length; within normal limits in size and echotexture.  Scattered small foci of increased echogenicity within the spleen likely reflect calcified granulomata, measuring up to 3 mm in size.  Right kidney:  10.2 cm in length; normal in size, configuration and parenchymal echogenicity.  No evidence of mass or hydronephrosis.  Left kidney:  11.0 cm in length; normal in size, configuration and parenchymal echogenicity.  No evidence of mass or hydronephrosis.  Abdominal Aorta:  Normal in caliber; no aneurysm identified.  IMPRESSION:  1.  Question of trace fluid about the pancreas.  Given the patient's elevated lipase, pancreatitis is considered likely. 2.  Cholelithiasis; apparent thickening of the gallbladder wall to 0.5 cm, without additional evidence to suggest cholecystitis.  No ultrasonographic Murphy's sign seen. 3.  Scattered calcified granulomata within the spleen.   Original Report Authenticated By: Tonia Ghent, M.D.    Ct Abdomen Pelvis W Contrast  08/15/2012  *RADIOLOGY REPORT*  Clinical Data: Abdominal pain, radiating to the back; nausea and vomiting.  CT ABDOMEN AND PELVIS WITH CONTRAST  Technique:  Multidetector CT imaging of the abdomen and pelvis was performed  following the standard protocol during bolus administration of intravenous contrast.  Contrast: OMNIPAQUE IOHEXOL 300 MG/ML  SOLN  Comparison: Abdominal ultrasound performed earlier today at 01:44 a.m.  Findings: The visualized lung bases are clear.  There is fluid noted tracking about the entirety of the pancreas, extending inferiorly along Gerota's fascia  bilaterally, more prominent on the left.  Associated soft tissue inflammation is noted.  Findings are compatible with acute pancreatitis.  There is no definite evidence of devascularization or pseudocyst formation, though the pancreas is difficult to fully assess due to motion artifact.  A small amount of pericholecystic fluid is noted about the gallbladder.  This may be reactive in nature.  Known gallstones are not well characterized on CT.  There is no evidence of common hepatic duct dilatation to suggest obstruction.  The adrenal glands are unremarkable in appearance.  The spleen is within normal limits.  The liver is grossly unremarkable in appearance.  The kidneys are unremarkable in appearance.  There is no evidence of hydronephrosis.  No renal or ureteral stones are seen.  No perinephric stranding is appreciated.  No free fluid is identified.  The small bowel is unremarkable in appearance.  The stomach is within normal limits.  No acute vascular abnormalities are seen.  The appendix is not definitely seen; there is no evidence for appendicitis.  The colon is grossly unremarkable in appearance.  The bladder is moderately distended and grossly normal in appearance.  The patient's intrauterine device is noted in abnormally low position, with one of the prongs apparently extending into the myometrium.  The uterus is otherwise unremarkable in appearance.  The ovaries are relatively symmetric, though difficult to fully characterize; no suspicious adnexal masses are seen.  No inguinal lymphadenopathy is seen.  No acute osseous abnormalities are identified.  IMPRESSION:  1.  Acute pancreatitis noted, with fluid tracking about the entirety of the pancreas, and extending inferiorly along Gerota's fascia bilaterally, more prominent on the left.  Associated soft tissue inflammation seen.  No definite evidence of devascularization or pseudocyst formation, though evaluation is mildly suboptimal due to motion artifact. 2.   Small amount of pericholecystic fluid about the gallbladder may be reactive in nature; known cholelithiasis not well characterized on CT.  No evidence to suggest obstruction. 3.  Intrauterine device noted in abnormally low position in the uterus, with one of the prongs apparently extending into the myometrium.  Recommend clinical correlation and adjustment.   Original Report Authenticated By: Tonia Ghent, M.D.      ASSESSMENT / PLAN:   Acute pancreatitis, likely biliary (passed gallstone). No evidence for biliary obstruction at present. LFTs normal today. Still having some epigastric discomfort. Continue clears. WBC coming down, HCT 36. IVF have been at 237ml/hr. Will decrease to 150cc. Recommend surgical consult for consideration of eventual cholecystectomy    LOS: 2 days   Willette Cluster  08/16/2012, 10:08 AM

## 2012-08-16 NOTE — Care Management Note (Signed)
  Page 1 of 1   08/16/2012     12:02:48 PM   CARE MANAGEMENT NOTE 08/16/2012  Patient:  KELTY, SZAFRAN   Account Number:  [id number]  Date Initiated:  08/15/2012  Documentation initiated by:  Ronny Flurry  Subjective/Objective Assessment:   Acute pancreatitis     Action/Plan:   NPO   Anticipated DC Date:  08/17/2012   Anticipated DC Plan:  HOME/SELF CARE         Choice offered to / List presented to:             Status of service:   Medicare Important Message given?   (If response is "NO", the following Medicare IM given date fields will be blank) Date Medicare IM given:   Date Additional Medicare IM given:    Discharge Disposition:    Per UR Regulation:  Reviewed for med. necessity/level of care/duration of stay  If discussed at Long Length of Stay Meetings, dates discussed:    Comments:  08-16-12 Referral for medication assistance . Patient is eligible for [employer] fund assistance at discharge. Will need 2 sets of prescriptions  Ronny Flurry RN BSN [phone]

## 2012-08-17 ENCOUNTER — Encounter (HOSPITAL_COMMUNITY): Payer: Self-pay | Admitting: Anesthesiology

## 2012-08-17 ENCOUNTER — Encounter (HOSPITAL_COMMUNITY)
Admission: EM | Disposition: A | Discharge status: Home / Self Care | Payer: Self-pay | Source: Home / Self Care | Attending: Internal Medicine

## 2012-08-17 LAB — COMPREHENSIVE METABOLIC PANEL
AST: 16 U/L (ref 0–37)
BUN: 3 mg/dL — ABNORMAL LOW (ref 6–23)
CO2: 24 mEq/L (ref 19–32)
Calcium: 8.7 mg/dL (ref 8.4–10.5)
Chloride: 107 mEq/L (ref 96–112)
Creatinine, Ser: 0.52 mg/dL (ref 0.50–1.10)
GFR calc Af Amer: >90 mL/min (ref >90)
GFR calc non Af Amer: >90 mL/min (ref >90)
Glucose, Bld: 82 mg/dL (ref 70–99)
Total Bilirubin: 0.5 mg/dL (ref 0.3–1.2)

## 2012-08-17 LAB — LIPASE, BLOOD: Lipase: 80 U/L — ABNORMAL HIGH (ref 11–59)

## 2012-08-17 SURGERY — LAPAROSCOPIC CHOLECYSTECTOMY SINGLE SITE
Anesthesia: General

## 2012-08-17 MED ORDER — LACTATED RINGERS IV BOLUS (SEPSIS): 1000.0000 mL | Freq: Three times a day (TID) | INTRAVENOUS | Status: DC | PRN

## 2012-08-17 MED ORDER — SODIUM CHLORIDE 0.9 % IV SOLN
INTRAVENOUS | Status: DC
  Administered 2012-08-17: 22:00:00 via INTRAVENOUS
  Administered 2012-08-17: 1000 mL via INTRAVENOUS
  Administered 2012-08-18: 06:00:00 via INTRAVENOUS

## 2012-08-17 MED ORDER — DIPHENHYDRAMINE HCL 50 MG/ML IJ SOLN: 12.5000 mg | Freq: Four times a day (QID) | INTRAMUSCULAR | Status: DC | PRN

## 2012-08-17 MED ORDER — SODIUM CHLORIDE 0.9 % IV SOLN
3.0000 g | INTRAVENOUS | Status: AC
  Administered 2012-08-18: 3 g via INTRAVENOUS
  Filled 2012-08-17: qty 3

## 2012-08-17 MED ORDER — MAGIC MOUTHWASH
15.0000 mL | Freq: Four times a day (QID) | ORAL | Status: DC | PRN
  Filled 2012-08-17: qty 15

## 2012-08-17 MED ORDER — PROMETHAZINE HCL 25 MG/ML IJ SOLN: 12.5000 mg | Freq: Four times a day (QID) | INTRAMUSCULAR | Status: DC | PRN

## 2012-08-17 MED ORDER — ALUM & MAG HYDROXIDE-SIMETH 200-200-20 MG/5ML PO SUSP: 30.0000 mL | Freq: Four times a day (QID) | ORAL | Status: DC | PRN

## 2012-08-17 MED ORDER — ACETAMINOPHEN 650 MG RE SUPP: 650.0000 mg | Freq: Four times a day (QID) | RECTAL | Status: DC | PRN

## 2012-08-17 MED ORDER — ENOXAPARIN SODIUM 40 MG/0.4ML ~~LOC~~ SOLN
40.0000 mg | SUBCUTANEOUS | Status: DC
  Filled 2012-08-17 (×3): qty 0.4

## 2012-08-17 MED ORDER — BLISTEX EX OINT
TOPICAL_OINTMENT | CUTANEOUS | Status: DC | PRN
  Filled 2012-08-17: qty 10

## 2012-08-17 MED ORDER — ACETAMINOPHEN 325 MG PO TABS: 325.0000 mg | ORAL_TABLET | Freq: Four times a day (QID) | ORAL | Status: DC | PRN

## 2012-08-17 MED ORDER — MAGNESIUM HYDROXIDE 400 MG/5ML PO SUSP: 30.0000 mL | Freq: Two times a day (BID) | ORAL | Status: DC | PRN

## 2012-08-17 MED ORDER — LIP MEDEX EX OINT
1.0000 "application " | TOPICAL_OINTMENT | Freq: Two times a day (BID) | CUTANEOUS | Status: DC
  Filled 2012-08-17: qty 7

## 2012-08-17 MED ORDER — BISACODYL 10 MG RE SUPP: 10.0000 mg | Freq: Two times a day (BID) | RECTAL | Status: DC | PRN

## 2012-08-17 NOTE — Progress Notes (Signed)
Patient ID: Allyson Sabal, female   DOB: 1991/08/03, 21 y.o.   MRN: [id number]    Subjective: Pt feels ok today.  No nausea.  A little bit of discomfort, but much improved.  Objective: Vital signs in last 24 hours: Temp:  [98.5 F (36.9 C)-99.3 F (37.4 C)] 98.5 F (36.9 C) (11/08 0521) Pulse Rate:  [72-82] 72  (11/08 0521) Resp:  [16-20] 16  (11/08 0521) BP: (109-115)/(49-55) 109/55 mmHg (11/08 0521) SpO2:  [99 %-100 %] 99 % (11/08 0521) Weight:  [149 lb (67.586 kg)] 149 lb (67.586 kg) (11/08 0521) Last BM Date: 08/14/12  Intake/Output from previous day: 11/07 0701 - 11/08 0700 In: 4910 [P.O.:240; I.V.:4670] Out: -  Intake/Output this shift:    PE: Abd: soft, minimally tender, +BS, ND Heart: regular Lungs: CTAB  Lab Results:   Basename 08/16/12 0555 08/15/12 1055  WBC 12.9* 16.1*  HGB 11.5* 12.6  HCT 36.3 38.1  PLT 242 290   BMET  Basename 08/16/12 0555 08/15/12 1055 08/15/12 0019  NA 139 -- 136  K 3.8 -- 3.8  CL 106 -- 100  CO2 18* -- 26  GLUCOSE 55* -- 159*  BUN 9 -- 11  CREATININE 0.53 0.55 --  CALCIUM 8.5 -- 9.9   PT/INR No results found for this basename: LABPROT:2,INR:2 in the last 72 hours CMP     Component Value Date/Time   NA 139 08/16/2012 0555   K 3.8 08/16/2012 0555   CL 106 08/16/2012 0555   CO2 18* 08/16/2012 0555   GLUCOSE 55* 08/16/2012 0555   BUN 9 08/16/2012 0555   CREATININE 0.53 08/16/2012 0555   CALCIUM 8.5 08/16/2012 0555   PROT 6.6 08/16/2012 0555   ALBUMIN 3.2* 08/16/2012 0555   AST 32 08/16/2012 0555   ALT 32 08/16/2012 0555   ALKPHOS 81 08/16/2012 0555   BILITOT 0.6 08/16/2012 0555   GFRNONAA >90 08/16/2012 0555   GFRAA >90 08/16/2012 0555   Lipase     Component Value Date/Time   LIPASE 598* 08/16/2012 0555       Studies/Results: No results found.  Anti-infectives: Anti-infectives    None       Assessment/Plan  1. Gallstone pancreatitis  Plan: 1. No labs ordered this morning.  Ordered a CMET and Lipase. 2.  NPO, if lipase normalized, may try and remove gallbladder today.  If not, then will let eat and plan for lap chole tomorrow.   LOS: 3 days    Carrick Rijos E 08/17/2012, 7:51 AM Pager: [phone]

## 2012-08-17 NOTE — Progress Notes (Signed)
Medical Student Daily Progress Note   Subjective:    Interval Events:  No acute overnight events. Patient reports epigastric pain is resolved. Tolerating clear liquid diet.  No emesis, nausea, diarrhea, or constipation.    Objective:    Vital Signs:   Temp:  [98.5 F (36.9 C)-99.3 F (37.4 C)] 98.5 F (36.9 C) (11/08 0521) Pulse Rate:  [72-82] 72  (11/08 0521) Resp:  [16-20] 16  (11/08 0521) BP: (109-115)/(49-55) 109/55 mmHg (11/08 0521) SpO2:  [99 %-100 %] 99 % (11/08 0521) Weight:  [67.586 kg (149 lb)] 67.586 kg (149 lb) (11/08 0521) Last BM Date: 08/14/12   Weights: 24-hour Weight change: 1.814 kg (4 lb)  Filed Weights   08/15/12 0913 08/16/12 0630 08/17/12 0521  Weight: 65.772 kg (145 lb) 65.7 kg (144 lb 13.5 oz) 67.586 kg (149 lb)     Intake/Output:   Intake/Output Summary (Last 24 hours) at 08/17/12 0726 Last data filed at 08/17/12 0552  Gross per 24 hour  Intake 4909.99 ml  Output      0 ml  Net 4909.99 ml       Physical Exam: GENERAL:  alert and oriented; resting comfortably in bed and in no distress EYES:  pupils equal, round, and reactive to light; sclera anicteric ENT:  moist mucosa LUNGS:  clear to auscultation bilaterally, normal work of breathing HEART:  normal rate; regular rhythm; normal S1 and S2, no S3 or S4 appreciated; no murmurs, rubs, or clicks ABDOMEN:  soft, non-tender, normal bowel sounds all quadrants, no masses palpated EXTREMITIES:  No edema SKIN:  normal turgor    Labs: Basic Metabolic Panel:  Lab 08/16/12 1610 08/15/12 1055 08/15/12 0019  NA 139 -- 136  K 3.8 -- 3.8  CL 106 -- 100  CO2 18* -- 26  GLUCOSE 55* -- 159*  BUN 9 -- 11  CREATININE 0.53 0.55 0.63  CALCIUM 8.5 -- 9.9  MG -- -- --  PHOS -- -- --    Liver Function Tests:  Lab 08/16/12 0555 08/15/12 0019  AST 32 84*  ALT 32 54*  ALKPHOS 81 92  BILITOT 0.6 0.8  PROT 6.6 8.0  ALBUMIN 3.2* 4.3    Lab 08/16/12 0555 08/15/12 0019  LIPASE 598* >3000*    AMYLASE -- --   No results found for this basename: AMMONIA:3 in the last 168 hours  CBC:  Lab 08/16/12 0555 08/15/12 1055 08/15/12 0019  WBC 12.9* 16.1* 21.2*  NEUTROABS -- -- 19.2*  HGB 11.5* 12.6 12.9  HCT 36.3 38.1 38.5  MCV 89.2 87.4 87.1  PLT 242 290 288    Cardiac Enzymes: No results found for this basename: CKTOTAL:5,CKMB:5,CKMBINDEX:5,TROPONINI:5 in the last 168 hours  BNP (last 3 results): No results found for this basename: PROBNP:3 in the last 8760 hours  CBG: No results found for this basename: GLUCAP:5 in the last 168 hours  Coagulation Studies: No results found for this basename: LABPROT:5,INR:5 in the last 72 hours  Microbiology: Results for orders placed during the hospital encounter of 03/09/11  MRSA PCR SCREENING     Status: Normal   Collection Time   03/10/11  1:31 PM      Component Value Range Status Comment   MRSA by PCR    NEGATIVE Final    Value: NEGATIVE            The GeneXpert MRSA Assay (FDA     approved for NASAL specimens     only), is one component of a  comprehensive MRSA colonization     surveillance program. It is not     intended to diagnose MRSA     infection nor to guide or     monitor treatment for     MRSA infections.    Imaging: US Abdomen Complete  08/15/2012 *RADIOLOGY REPORT* Clinical Data: Right upper quadrant abdominal pain, elevated LFTs, leukocytosis and elevated lipase. ABDOMINAL ULTRASOUND COMPLETE Comparison: None Findings: Gallbladder: Multiple stones of varying size are noted within the gallbladder, several which are adherent to the gallbladder wall. The gallbladder wall appears thickened, measuring 0.5 cm, but no significant pericholecystic fluid is seen, and no ultrasonographic Murphy's sign is elicited. Common Bile Duct: 0.5 cm in diameter; within normal limits in caliber. Liver: Normal parenchymal echogenicity and echotexture; no focal lesions identified. Limited Doppler evaluation demonstrates normal blood  flow within the liver. IVC: Unremarkable in appearance. Pancreas: There is question of trace fluid about the pancreas, though this is not well characterized on ultrasound. Given the patient's elevated lipase, pancreatitis is considered likely. Spleen: 9.4 cm in length; within normal limits in size and echotexture. Scattered small foci of increased echogenicity within the spleen likely reflect calcified granulomata, measuring up to 3 mm in size. Right kidney: 10.2 cm in length; normal in size, configuration and parenchymal echogenicity. No evidence of mass or hydronephrosis. Left kidney: 11.0 cm in length; normal in size, configuration and parenchymal echogenicity. No evidence of mass or hydronephrosis. Abdominal Aorta: Normal in caliber; no aneurysm identified. IMPRESSION: 1. Question of trace fluid about the pancreas. Given the patient's elevated lipase, pancreatitis is considered likely. 2. Cholelithiasis; apparent thickening of the gallbladder wall to 0.5 cm, without additional evidence to suggest cholecystitis. No ultrasonographic Murphy's sign seen. 3. Scattered calcified granulomata within the spleen. Original Report Authenticated By: Tonia Ghent, M.D.  Ct Abdomen Pelvis W Contrast  08/15/2012 *RADIOLOGY REPORT* Clinical Data: Abdominal pain, radiating to the back; nausea and vomiting. CT ABDOMEN AND PELVIS WITH CONTRAST Technique: Multidetector CT imaging of the abdomen and pelvis was performed following the standard protocol during bolus administration of intravenous contrast. Contrast: OMNIPAQUE IOHEXOL 300 MG/ML SOLN Comparison: Abdominal ultrasound performed earlier today at 01:44 a.m. Findings: The visualized lung bases are clear. There is fluid noted tracking about the entirety of the pancreas, extending inferiorly along Gerota's fascia bilaterally, more prominent on the left. Associated soft tissue inflammation is noted. Findings are compatible with acute pancreatitis. There is no definite  evidence of devascularization or pseudocyst formation, though the pancreas is difficult to fully assess due to motion artifact. A small amount of pericholecystic fluid is noted about the gallbladder. This may be reactive in nature. Known gallstones are not well characterized on CT. There is no evidence of common hepatic duct dilatation to suggest obstruction. The adrenal glands are unremarkable in appearance. The spleen is within normal limits. The liver is grossly unremarkable in appearance. The kidneys are unremarkable in appearance. There is no evidence of hydronephrosis. No renal or ureteral stones are seen. No perinephric stranding is appreciated. No free fluid is identified. The small bowel is unremarkable in appearance. The stomach is within normal limits. No acute vascular abnormalities are seen. The appendix is not definitely seen; there is no evidence for appendicitis. The colon is grossly unremarkable in appearance. The bladder is moderately distended and grossly normal in appearance. The patient's intrauterine device is noted in abnormally low position, with one of the prongs apparently extending into the myometrium. The uterus is otherwise unremarkable in appearance. The ovaries  are relatively symmetric, though difficult to fully characterize; no suspicious adnexal masses are seen. No inguinal lymphadenopathy is seen. No acute osseous abnormalities are identified. IMPRESSION: 1. Acute pancreatitis noted, with fluid tracking about the entirety of the pancreas, and extending inferiorly along Gerota's fascia bilaterally, more prominent on the left. Associated soft tissue inflammation seen. No definite evidence of devascularization or pseudocyst formation, though evaluation is mildly suboptimal due to motion artifact. 2. Small amount of pericholecystic fluid about the gallbladder may be reactive in nature; known cholelithiasis not well characterized on CT. No evidence to suggest obstruction. 3. Intrauterine  device noted in abnormally low position in the uterus, with one of the prongs apparently extending into the myometrium. Recommend clinical correlation and adjustment. Original Report Authenticated By: Tonia Ghent, M.D.     Medications:    Infusions:    . sodium chloride 200 mL/hr at 08/17/12 0501   Followed by  . sodium chloride       Scheduled Medications:    . antiseptic oral rinse  15 mL Mouth Rinse BID  . enoxaparin (LOVENOX) injection  40 mg Subcutaneous Q24H  . [COMPLETED] influenza  inactive virus vaccine  0.5 mL Intramuscular Tomorrow-1000     PRN Medications: fentaNYL, ondansetron (ZOFRAN) IV, ondansetron, [DISCONTINUED] fentaNYL, [DISCONTINUED]  morphine injection    Assessment/ Plan:    21 y/o female with PMHx of preeclampsia was admitted with nausea, vomiting, and abdominal pain for 3 days, lipase was found to be 3000, and imaging revealed acute pancreatitis without obstruction.  1. Acute pancreatitis: most likely secondary to passed gallstone. No evidence for current biliary obstruction. Pain has resolved with bowel rest and aggressive IV fluids. Tolerating clear liquid diet will advance. LFTs WNL, Lipase trending down. Surgery consulted for inpatient consultation and possible cholecystectomy as outpatient. Pain control with Fentanyl PRN. Will decrease IVF to 83ml/hr.   DVT PPX: low molecular weight heparin  Full Code       Length of Stay: 3 days   This is a Psychologist, occupational Note.  The care of the patient was discussed with Dr. Bosie Clos and the assessment and plan formulated with their assistance.  Please see their attached note or addendum for official documentation of the daily encounter.

## 2012-08-17 NOTE — Progress Notes (Signed)
Internal Medicine Attending  Date: 08/17/2012  Patient name: Mary Mcintosh Medical record number: [id number] Date of birth: 1990-12-28 Age: 21 y.o. Gender: female  I saw and evaluated the patient, and discussed her care on a.m. rounds with house staff. She is doing well, her abdominal pain has resolved and she has no abdominal tenderness on exam.  Her lipase today is 80.  Surgery consult is planning laparoscopic cholecystectomy.

## 2012-08-17 NOTE — Progress Notes (Signed)
I have seen and examined the patient and agree with the assessment and plans.  Lipase still up, but clinically better.  Will discuss with Dr. Michaell Cowing.  Esmeralda Blanford A. Magnus Ivan  MD, FACS

## 2012-08-17 NOTE — H&P (Signed)
Follow clinically.  Hip pain decreases and lipase normalizes, plan cholecystectomy later this admit prior to d/c.  We will wait until those goals are met.  If markedly worsens or has worsening laboratory values, may need intervention with ERCP first

## 2012-08-18 ENCOUNTER — Encounter (HOSPITAL_COMMUNITY): Payer: Self-pay | Admitting: Anesthesiology

## 2012-08-18 ENCOUNTER — Encounter (HOSPITAL_COMMUNITY)
Admission: EM | Disposition: A | Discharge status: Home / Self Care | Payer: Self-pay | Source: Home / Self Care | Attending: Internal Medicine

## 2012-08-18 ENCOUNTER — Observation Stay (HOSPITAL_COMMUNITY): Payer: Medicaid Other | Admitting: Anesthesiology

## 2012-08-18 ENCOUNTER — Observation Stay (HOSPITAL_COMMUNITY): Payer: Medicaid Other

## 2012-08-18 HISTORY — PX: CHOLECYSTECTOMY: SHX55

## 2012-08-18 LAB — LIPASE, BLOOD: Lipase: 66 U/L — ABNORMAL HIGH (ref 11–59)

## 2012-08-18 SURGERY — LAPAROSCOPIC CHOLECYSTECTOMY WITH INTRAOPERATIVE CHOLANGIOGRAM
Anesthesia: General | Site: Abdomen | Wound class: Contaminated

## 2012-08-18 MED ORDER — NEOSTIGMINE METHYLSULFATE 1 MG/ML IJ SOLN
INTRAMUSCULAR | Status: DC | PRN
  Administered 2012-08-18: 2 mg via INTRAVENOUS

## 2012-08-18 MED ORDER — LACTATED RINGERS IV SOLN
INTRAVENOUS | Status: DC | PRN
  Administered 2012-08-18: 10:00:00 via INTRAVENOUS

## 2012-08-18 MED ORDER — SODIUM CHLORIDE 0.9 % IV SOLN
INTRAVENOUS | Status: DC | PRN
  Administered 2012-08-18: 08:00:00

## 2012-08-18 MED ORDER — PROPOFOL 10 MG/ML IV BOLUS
INTRAVENOUS | Status: DC | PRN
  Administered 2012-08-18: 100 mg via INTRAVENOUS

## 2012-08-18 MED ORDER — LACTATED RINGERS IV SOLN: INTRAVENOUS | Status: DC | PRN

## 2012-08-18 MED ORDER — HYDROMORPHONE HCL PF 1 MG/ML IJ SOLN
0.2500 mg | INTRAMUSCULAR | Status: DC | PRN
  Administered 2012-08-18 (×2): 0.5 mg via INTRAVENOUS

## 2012-08-18 MED ORDER — ACETAMINOPHEN 10 MG/ML IV SOLN: 1000.0000 mg | Freq: Once | INTRAVENOUS | Status: DC | PRN

## 2012-08-18 MED ORDER — BUPIVACAINE HCL 0.25 % IJ SOLN
INTRAMUSCULAR | Status: DC | PRN
  Administered 2012-08-18: 12 mL

## 2012-08-18 MED ORDER — FENTANYL CITRATE 0.05 MG/ML IJ SOLN
INTRAMUSCULAR | Status: DC | PRN
  Administered 2012-08-18: 100 ug via INTRAVENOUS
  Administered 2012-08-18: 150 ug via INTRAVENOUS
  Administered 2012-08-18: 50 ug via INTRAVENOUS
  Administered 2012-08-18: 100 ug via INTRAVENOUS

## 2012-08-18 MED ORDER — MIDAZOLAM HCL 5 MG/5ML IJ SOLN
INTRAMUSCULAR | Status: DC | PRN
  Administered 2012-08-18: 2 mg via INTRAVENOUS

## 2012-08-18 MED ORDER — ONDANSETRON HCL 4 MG/2ML IJ SOLN: 4.0000 mg | Freq: Once | INTRAMUSCULAR | Status: DC | PRN

## 2012-08-18 MED ORDER — SODIUM CHLORIDE 0.9 % IV SOLN
INTRAVENOUS | Status: DC | PRN
  Administered 2012-08-18: 09:00:00 via INTRAVENOUS

## 2012-08-18 MED ORDER — LIDOCAINE HCL (CARDIAC) 20 MG/ML IV SOLN
INTRAVENOUS | Status: DC | PRN
  Administered 2012-08-18: 50 mg via INTRAVENOUS

## 2012-08-18 MED ORDER — ROCURONIUM BROMIDE 100 MG/10ML IV SOLN
INTRAVENOUS | Status: DC | PRN
  Administered 2012-08-18: 40 mg via INTRAVENOUS
  Administered 2012-08-18: 10 mg via INTRAVENOUS

## 2012-08-18 MED ORDER — GLYCOPYRROLATE 0.2 MG/ML IJ SOLN
INTRAMUSCULAR | Status: DC | PRN
  Administered 2012-08-18: 0.4 mg via INTRAVENOUS

## 2012-08-18 MED ORDER — HYDROCODONE-ACETAMINOPHEN 5-325 MG PO TABS
1.0000 | ORAL_TABLET | ORAL | Status: DC | PRN
  Administered 2012-08-18 – 2012-08-19 (×2): 2 via ORAL
  Filled 2012-08-18 (×2): qty 2

## 2012-08-18 MED ORDER — SODIUM CHLORIDE 0.9 % IR SOLN
Status: DC | PRN
  Administered 2012-08-18: 1000 mL

## 2012-08-18 MED ORDER — BUPIVACAINE HCL (PF) 0.25 % IJ SOLN
INTRAMUSCULAR | Status: AC
  Filled 2012-08-18: qty 30

## 2012-08-18 SURGICAL SUPPLY — 39 items
APPLIER CLIP 5 13 M/L LIGAMAX5 (MISCELLANEOUS) ×2
BENZOIN TINCTURE PRP APPL 2/3 (GAUZE/BANDAGES/DRESSINGS) ×2 IMPLANT
CANISTER SUCTION 2500CC (MISCELLANEOUS) ×2 IMPLANT
CHLORAPREP W/TINT 26ML (MISCELLANEOUS) ×2 IMPLANT
CLIP APPLIE 5 13 M/L LIGAMAX5 (MISCELLANEOUS) ×1 IMPLANT
CLOTH BEACON ORANGE TIMEOUT ST (SAFETY) ×2 IMPLANT
COVER MAYO STAND STRL (DRAPES) ×2 IMPLANT
COVER SURGICAL LIGHT HANDLE (MISCELLANEOUS) ×2 IMPLANT
DECANTER SPIKE VIAL GLASS SM (MISCELLANEOUS) ×4 IMPLANT
DEVICE TROCAR PUNCTURE CLOSURE (ENDOMECHANICALS) ×2 IMPLANT
DRAPE C-ARM 42X72 X-RAY (DRAPES) ×2 IMPLANT
DRAPE UTILITY XL STRL (DRAPES) ×4 IMPLANT
ELECT REM PT RETURN 9FT ADLT (ELECTROSURGICAL) ×2
ELECTRODE REM PT RTRN 9FT ADLT (ELECTROSURGICAL) ×1 IMPLANT
GAUZE SPONGE 2X2 8PLY STRL LF (GAUZE/BANDAGES/DRESSINGS) ×1 IMPLANT
GLOVE BIO SURGEON STRL SZ7.5 (GLOVE) ×4 IMPLANT
GOWN STRL NON-REIN LRG LVL3 (GOWN DISPOSABLE) ×8 IMPLANT
GOWN STRL REIN XL XLG (GOWN DISPOSABLE) ×2 IMPLANT
IV CATH 14GX2 1/4 (CATHETERS) ×2 IMPLANT
KIT BASIN OR (CUSTOM PROCEDURE TRAY) ×2 IMPLANT
KIT ROOM TURNOVER OR (KITS) ×2 IMPLANT
NEEDLE INSUFFLATION 14GA 120MM (NEEDLE) ×2 IMPLANT
NS IRRIG 1000ML POUR BTL (IV SOLUTION) ×2 IMPLANT
PAD ARMBOARD 7.5X6 YLW CONV (MISCELLANEOUS) ×4 IMPLANT
POUCH SPECIMEN RETRIEVAL 10MM (ENDOMECHANICALS) ×2 IMPLANT
SCISSORS LAP 5X35 DISP (ENDOMECHANICALS) ×2 IMPLANT
SET CHOLANGIOGRAPHY FRANKLIN (SET/KITS/TRAYS/PACK) ×2 IMPLANT
SET IRRIG TUBING LAPAROSCOPIC (IRRIGATION / IRRIGATOR) ×2 IMPLANT
SLEEVE ENDOPATH XCEL 5M (ENDOMECHANICALS) ×4 IMPLANT
SPECIMEN JAR SMALL (MISCELLANEOUS) ×2 IMPLANT
SPONGE GAUZE 2X2 STER 10/PKG (GAUZE/BANDAGES/DRESSINGS) ×1
STRIP CLOSURE SKIN 1/2X4 (GAUZE/BANDAGES/DRESSINGS) ×2 IMPLANT
SUT MNCRL AB 3-0 PS2 18 (SUTURE) ×2 IMPLANT
TAPE CLOTH SURG 4X10 WHT LF (GAUZE/BANDAGES/DRESSINGS) ×2 IMPLANT
TOWEL OR 17X24 6PK STRL BLUE (TOWEL DISPOSABLE) ×2 IMPLANT
TOWEL OR 17X26 10 PK STRL BLUE (TOWEL DISPOSABLE) ×2 IMPLANT
TRAY LAPAROSCOPIC (CUSTOM PROCEDURE TRAY) ×2 IMPLANT
TROCAR XCEL NON-BLD 11X100MML (ENDOMECHANICALS) ×2 IMPLANT
TROCAR XCEL NON-BLD 5MMX100MML (ENDOMECHANICALS) ×2 IMPLANT

## 2012-08-18 NOTE — Anesthesia Preprocedure Evaluation (Addendum)
Anesthesia Evaluation  Patient identified by MRN, date of birth, ID band Patient awake    Reviewed: Allergy & Precautions, H&P , NPO status , Patient's Chart, lab work & pertinent test results  Airway Mallampati: II TM Distance: >3 FB Neck ROM: Full    Dental  (+) Dental Advisory Given, Teeth Intact and Partial Upper   Pulmonary  breath sounds clear to auscultation        Cardiovascular Rhythm:Regular Rate:Normal     Neuro/Psych  Headaches,    GI/Hepatic   Endo/Other    Renal/GU      Musculoskeletal   Abdominal   Peds  Hematology   Anesthesia Other Findings   Reproductive/Obstetrics                         Anesthesia Physical Anesthesia Plan  ASA: II  Anesthesia Plan: General   Post-op Pain Management:    Induction: Intravenous  Airway Management Planned: Oral ETT  Additional Equipment:   Intra-op Plan:   Post-operative Plan:   Informed Consent: I have reviewed the patients History and Physical, chart, labs and discussed the procedure including the risks, benefits and alternatives for the proposed anesthesia with the patient or authorized representative who has indicated his/her understanding and acceptance.   Dental advisory given  Plan Discussed with: CRNA and Surgeon  Anesthesia Plan Comments:         Anesthesia Quick Evaluation

## 2012-08-18 NOTE — Op Note (Signed)
Pre Operative Diagnosis: Biliary Pancreatitis  Post Operative Diagnosis: Same  Surgeon: Dr. Axel Filler   Procedure: Lap chole with IOC  Assistant: None  Anesthesia: Gen. Endotracheal anesthesia   EBL: 5cc  Complications:  Counts: reported as correct x 2   Findings: The patient had Normal IOC  Indications for procedure: Pt is a  8 F with biliary pancreatitis, with decreasing enxymes.  Pt was schedulued for urgent Lap chole  Details of the procedure:  The patient was taken to the operating and placed in the supine position with bilateral SCDs in place. A time out was called and all facts were verified. A pneumoperitoneum was obtained via A Veress needle technique to a pressure of 14mm of mercury. A 5mm trochar was then placed in the right upper quadrant under visualization, and there were no injuries to any abdominal organs. A 11 mm port was then placed in the umbilical region after infiltrating with local anesthesia under direct visualization. A second  epigastric port was placement under direct visualization. The gallbladder was identified and retracted, the peritoneum was then sharply dissected from the gallbladder and this dissection was carried down to Calot's triangle. The gallbladder was identified and stripped away circumferentially and seen going into the gallbladder 360. A Cook catheter was used to perform an intraoperative cholangiogram. The biliary radicals as well as the cystic duct and common bile duct were seen free of filling defects.  2 clips were placed proximally one distally and the cystic duct transected. The cystic artery was identified and 2 clips placed proximally and one distally and transected.  We then proceeded to remove the gallbladder off the hepatic fossa with Bovie cautery. An Endo Catch bag was then placed in the abdomen and gallbladder placed in the bag. The hepatic fossa was then reexamined and hemostasis was achieved with Bovie cautery and was excellent  at the end of the case. The subhepatic fossa and perihepatic fossa was then irrigated until the effluent was clear. The 11 mm trocar fascia was reapproximated with the Endo Close #1 Vicryl.  The pneumoperitoneum was evacuated and all trochars removed under direct visulalization.  The skin was then closed with 4-0 Monocryl and the skin dressed with Steri-Strips, gauze, and tape.  The patient was awaken from general anesthesia and taken to the recovery room in stable condition.

## 2012-08-18 NOTE — Anesthesia Postprocedure Evaluation (Signed)
  Anesthesia Post-op Note  Patient: Mary Mcintosh  Procedure(s) Performed: Procedure(s) (LRB) with comments: LAPAROSCOPIC CHOLECYSTECTOMY WITH INTRAOPERATIVE CHOLANGIOGRAM (N/A)  Patient Location: PACU  Anesthesia Type:General  Level of Consciousness: awake, alert  and oriented  Airway and Oxygen Therapy: Patient Spontanous Breathing and Patient connected to nasal cannula oxygen  Post-op Pain: mild  Post-op Assessment: Post-op Vital signs reviewed and Patient's Cardiovascular Status Stable  Post-op Vital Signs: stable  Complications: No apparent anesthesia complications

## 2012-08-18 NOTE — Progress Notes (Signed)
Medical Student Daily Progress Note   Subjective:    Interval Events:  Pt seen in PACU.  Doing well postoperatively.  Pt reports some weakness.  Denies HA, N/V.    Objective:    Vital Signs:   Temp:  [98.4 F (36.9 C)-99.1 F (37.3 C)] 98.7 F (37.1 C) (11/09 1145) Pulse Rate:  [59-82] 66  (11/09 1145) Resp:  [11-18] 18  (11/09 1145) BP: (107-127)/(61-79) 124/78 mmHg (11/09 1115) SpO2:  [100 %] 100 % (11/09 1145) Weight:  [66.769 kg (147 lb 3.2 oz)] 66.769 kg (147 lb 3.2 oz) (11/09 0615) Last BM Date: 08/14/12   Weights: 24-hour Weight change: -0.816 kg (-1 lb 12.8 oz)  Filed Weights   08/16/12 0630 08/17/12 0521 08/18/12 0615  Weight: 65.7 kg (144 lb 13.5 oz) 67.586 kg (149 lb) 66.769 kg (147 lb 3.2 oz)    Intake/Output:   Intake/Output Summary (Last 24 hours) at 08/18/12 1149 Last data filed at 08/18/12 1034  Gross per 24 hour  Intake 3916.66 ml  Output     10 ml  Net 3906.66 ml      Physical Exam: GENERAL:  alert and oriented; resting comfortably in bed and in no distress LUNGS:  clear to auscultation bilaterally, normal work of breathing HEART:  normal rate; regular rhythm; normal S1 and S2, no S3 or S4 appreciated; no murmurs, rubs, or clicks ABDOMEN:  Soft, two surgical bandages in RUQ, Mild TTP in RUQ, normal bowel sounds, no masses palpated EXTREMITIES:  no edema SKIN:  normal turgor    Labs: Basic Metabolic Panel:  Lab 08/17/12 1610 08/16/12 0555 08/15/12 1055 08/15/12 0019  NA 141 139 -- 136  K 3.4* 3.8 -- 3.8  CL 107 106 -- 100  CO2 24 18* -- 26  GLUCOSE 82 55* -- 159*  BUN 3* 9 -- 11  CREATININE 0.52 0.53 0.55 0.63  CALCIUM 8.7 8.5 -- 9.9  MG -- -- -- --  PHOS -- -- -- --    Liver Function Tests:  Lab 08/17/12 0833 08/16/12 0555 08/15/12 0019  AST 16 32 84*  ALT 23 32 54*  ALKPHOS 71 81 92  BILITOT 0.5 0.6 0.8  PROT 6.4 6.6 8.0  ALBUMIN 3.1* 3.2* 4.3    Lab 08/17/12 0833 08/16/12 0555 08/15/12 0019  LIPASE 80* 598* >3000*    AMYLASE -- -- --   No results found for this basename: AMMONIA:3 in the last 168 hours  CBC:  Lab 08/16/12 0555 08/15/12 1055 08/15/12 0019  WBC 12.9* 16.1* 21.2*  NEUTROABS -- -- 19.2*  HGB 11.5* 12.6 12.9  HCT 36.3 38.1 38.5  MCV 89.2 87.4 87.1  PLT 242 290 288    Cardiac Enzymes: No results found for this basename: CKTOTAL:5,CKMB:5,CKMBINDEX:5,TROPONINI:5 in the last 168 hours  BNP (last 3 results): No results found for this basename: PROBNP:3 in the last 8760 hours  CBG: No results found for this basename: GLUCAP:5 in the last 168 hours  Coagulation Studies: No results found for this basename: LABPROT:5,INR:5 in the last 72 hours  Microbiology: Results for orders placed during the hospital encounter of 03/09/11  MRSA PCR SCREENING     Status: Normal   Collection Time   03/10/11  1:31 PM      Component Value Range Status Comment   MRSA by PCR    NEGATIVE Final    Value: NEGATIVE            The GeneXpert MRSA Assay (FDA  approved for NASAL specimens     only), is one component of a     comprehensive MRSA colonization     surveillance program. It is not     intended to diagnose MRSA     infection nor to guide or     monitor treatment for     MRSA infections.    Imaging: Dg Cholangiogram Operative  08/18/2012  *RADIOLOGY REPORT*  Clinical Data:   Cholecystitis  INTRAOPERATIVE CHOLANGIOGRAM  Technique:  Cholangiographic images from the C-arm fluoroscopic device were submitted for interpretation post-operatively.  Please see the procedural report for the amount of contrast and the fluoroscopy time utilized.  Comparison:  CT 08/15/2012  Findings:  Multiple spot fluoroscopic intraoperative views are provided.  Initially image demonstrates cannulation of the cystic duct following cholecystectomy.  Injection of contrast opacifies the common hepatic duct and common bile duct without evidence of filling defect.  Contrast flows into the duodenum.  Small amount of contrast  refluxes into the pancreatic duct.  IMPRESSION:  1.  No evidence of common bile duct stones or obstruction. 2.  No evidence of leak.   Original Report Authenticated By: Genevive Bi, M.D.       Medications:    Infusions:    . sodium chloride 125 mL/hr at 08/18/12 0531     Scheduled Medications:    . [COMPLETED] ampicillin-sulbactam (UNASYN) IV  3 g Intravenous On Call  . [facility] HOLD] antiseptic oral rinse  15 mL Mouth Rinse BID  . [MAR HOLD] enoxaparin (LOVENOX) injection  40 mg Subcutaneous Q24H  . [DISCONTINUED] lip balm  1 application Topical BID     PRN Medications: acetaminophen, [MAR HOLD] acetaminophen, [MAR HOLD] acetaminophen, [MAR HOLD] alum & mag hydroxide-simeth, [MAR HOLD] bisacodyl, bupivacaine, [MAR HOLD] diphenhydrAMINE, [MAR HOLD] fentaNYL, HYDROmorphone (DILAUDID) injection, [MAR HOLD] lactated ringers, [MAR HOLD] lip balm, [MAR HOLD] magic mouthwash, [MAR HOLD] magnesium hydroxide, Omnipaque 300 mg/mL (50 mL) in 0.9% normal saline (50 mL), [MAR HOLD] ondansetron (ZOFRAN) IV ondansetron (ZOFRAN) IV, [MAR HOLD] ondansetron, [MAR HOLD] promethazine, sodium chloride irrigation    Assessment/ Plan:    21 y/o female with PMHx of preeclampsia was admitted with nausea, vomiting, and abdominal pain for 3 days, lipase was found to be 3000, and imaging revealed acute pancreatitis without obstruction.    1. Gallstone pancreatitis:  Patient underwent Lap chole with IOC this AM with Dr. Derrell Lolling.  IOC was normal without gallstone or obstruction in CBD.  Patient tolerated procedure well and is currently in post anesthesia recovery.  Will observe patient overnight.  Cont IVF.  Surgery to advance diet.  DVT PPx: Lovenox Code Status: Full  Dispo: Will observe overnight.  Pt to be discharged home with outpatient follow up.      Length of Stay: 4 days   This is a Psychologist, occupational Note.  The care of the patient was discussed with Dr. Bosie Clos and the assessment and plan  formulated with their assistance.  Please see their attached note or addendum for official documentation of the daily encounter.

## 2012-08-18 NOTE — Transfer of Care (Signed)
Immediate Anesthesia Transfer of Care Note  Patient: Mary Mcintosh  Procedure(s) Performed: Procedure(s) (LRB) with comments: LAPAROSCOPIC CHOLECYSTECTOMY WITH INTRAOPERATIVE CHOLANGIOGRAM (N/A)  Patient Location: PACU  Anesthesia Type:General  Level of Consciousness: sedated  Airway & Oxygen Therapy: Patient Spontanous Breathing and Patient connected to nasal cannula oxygen  Post-op Assessment: Report given to PACU RN and Post -op Vital signs reviewed and stable  Post vital signs: Reviewed and stable  Complications: No apparent anesthesia complications

## 2012-08-18 NOTE — Progress Notes (Addendum)
Resident Addendum to Medical Student Note   I have seen and examined the patient, and agree with the the medical student assessment and plan outlined above. Please see my brief note below for additional details.  S: No acute events overnight   OBJECTIVE: VS: Reviewed  Meds: Reviewed  Labs: Reviewed  Imaging: Reviewed   Physical Exam: General: Well-developed, well-nourished, in no acute distress; pleasant Lungs: Normal respiratory effort. Clear to auscultation bilaterally from apices to bases without crackles or wheezes appreciated. Heart: normal rate, regular rhythm, normal S1 and S2, no gallop, murmur, or rubs appreciated. Abdomen: BS normoactive. Soft, mildly tender Extremities: No pretibial edema, distal pulses intact Neurologic: grossly non-focal, alert and oriented x3, appropriate and cooperative throughout examination.    ASSESSMENT/ PLAN: Pt is a 21 y.o. yo female with no significant medical history who was admitted on 08/14/2012 with symptoms of abdominal pain, which was determined to be secondary to gallstone pancreatitis.    Gallstone Pancreatitis: pain resolved, lipase trended down quickly to 80 today from > 3000 on admission.  -cholecystectomy per Surgery tomorrow   Length of Stay: 4   Kristie Cowman, MD PGY2, Internal Medicine Resident 08/18/2012, 12:57 PM

## 2012-08-18 NOTE — Anesthesia Procedure Notes (Signed)
Procedure Name: Intubation Date/Time: 08/18/2012 9:27 AM Performed by: Alanda Amass A Pre-anesthesia Checklist: Patient identified, Timeout performed, Emergency Drugs available, Suction available and Patient being monitored Patient Re-evaluated:Patient Re-evaluated prior to inductionOxygen Delivery Method: Circle system utilized Preoxygenation: Pre-oxygenation with 100% oxygen Intubation Type: IV induction Ventilation: Mask ventilation without difficulty Laryngoscope Size: Mac and 3 Grade View: Grade I Tube type: Oral Tube size: 7.5 mm Number of attempts: 1 Airway Equipment and Method: Stylet Placement Confirmation: ETT inserted through vocal cords under direct vision,  breath sounds checked- equal and bilateral and positive ETCO2 Secured at: 20 cm Tube secured with: Tape Dental Injury: Teeth and Oropharynx as per pre-operative assessment

## 2012-08-18 NOTE — Progress Notes (Signed)
Resident Addendum to Medical Student Note   I have seen and examined the patient, and agree with the the medical student assessment and plan outlined above. Please see my brief note below for additional details.  S: S/p lap cholecytectomy, doing well w/o complications   OBJECTIVE: VS: Reviewed  Meds: Reviewed  Labs: Reviewed  Imaging: Reviewed   Physical Exam: General: Well-developed, well-nourished, in no acute distress; pleasant Lungs: Normal respiratory effort. Clear to auscultation bilaterally from apices to bases without crackles or wheezes appreciated. Heart: normal rate, regular rhythm, normal S1 and S2, no gallop, murmur, or rubs appreciated. Abdomen: BS normoactive. Soft, TTP, bandage c/d/i Extremities: No pretibial edema, distal pulses intact Neurologic: grossly non-focal, alert and oriented x3, appropriate and cooperative throughout examination.    ASSESSMENT/ PLAN: Pt is a 21 y.o. yo female with no significant medical history who was admitted on 08/14/2012 with symptoms of abdominal pain, which was determined to be secondary to gallstone pancreatitis.   Gallstone Pancreatitis: s/p lap chole per Dr. Derrell Lolling of General Surgery -no complications, post op management per Surgery -likely d/c home tomorrow   Length of Stay: 4   Kristie Cowman, MD PGY2, Internal Medicine Resident 08/18/2012, 1:07 PM

## 2012-08-18 NOTE — Progress Notes (Signed)
Patient ID: Mary Mcintosh, female   DOB: Nov 30, 1990, 21 y.o.   MRN: [id number] Pt to OR today for Lap chole with IOC.  I d/w patient the procedure and risks of surgery in spanish.  Pt voiced understanding and wishes to proceed.

## 2012-08-19 DIAGNOSIS — Z9049 Acquired absence of other specified parts of digestive tract: Secondary | ICD-10-CM

## 2012-08-19 LAB — COMPREHENSIVE METABOLIC PANEL
ALT: 54 U/L — ABNORMAL HIGH (ref 0–35)
AST: 62 U/L — ABNORMAL HIGH (ref 0–37)
Alkaline Phosphatase: 73 U/L (ref 39–117)
Calcium: 9.3 mg/dL (ref 8.4–10.5)
GFR calc Af Amer: >90 mL/min (ref >90)
Glucose, Bld: 79 mg/dL (ref 70–99)
Potassium: 3.5 mEq/L (ref 3.5–5.1)
Sodium: 139 mEq/L (ref 135–145)
Total Protein: 7.1 g/dL (ref 6.0–8.3)

## 2012-08-19 MED ORDER — ACETAMINOPHEN 325 MG PO TABS: 325.0000 mg | ORAL_TABLET | Freq: Four times a day (QID) | ORAL | Status: DC | PRN

## 2012-08-19 NOTE — Progress Notes (Signed)
I have seen and examined the patient and agree with the assessment and plans.  Murel Shenberger A. Mong Neal  MD, FACS  

## 2012-08-19 NOTE — Progress Notes (Signed)
Patient ID: Mary Mcintosh, female   DOB: 1990-11-07, 21 y.o.   MRN: [id number] 1 Day Post-Op  Subjective: Pt feels well today.  Tolerating clear liquids without nausea or vomiting.  Ready for solid food.  Minimal pain.  Objective: Vital signs in last 24 hours: Temp:  [98 F (36.7 C)-98.9 F (37.2 C)] 98.8 F (37.1 C) (11/10 0557) Pulse Rate:  [58-76] 71  (11/10 0557) Resp:  [9-18] 17  (11/10 0557) BP: (113-126)/(65-80) 123/66 mmHg (11/10 0557) SpO2:  [91 %-100 %] 98 % (11/10 0557) Weight:  [154 lb 15.7 oz (70.3 kg)] 154 lb 15.7 oz (70.3 kg) (11/10 0557) Last BM Date: 08/14/12  Intake/Output from previous day: 11/09 0701 - 11/10 0700 In: 1460 [P.O.:360; I.V.:1100] Out: 12 [Urine:2; Blood:10] Intake/Output this shift:    PE: Abd: soft, appropriately tender, +BS, ND, incisions covered and dry  Lab Results:  No results found for this basename: WBC:2,HGB:2,HCT:2,PLT:2 in the last 72 hours BMET  Basename 08/19/12 0440 08/17/12 0833  NA 139 141  K 3.5 3.4*  CL 102 107  CO2 25 24  GLUCOSE 79 82  BUN 4* 3*  CREATININE 0.48* 0.52  CALCIUM 9.3 8.7   PT/INR No results found for this basename: LABPROT:2,INR:2 in the last 72 hours CMP     Component Value Date/Time   NA 139 08/19/2012 0440   K 3.5 08/19/2012 0440   CL 102 08/19/2012 0440   CO2 25 08/19/2012 0440   GLUCOSE 79 08/19/2012 0440   BUN 4* 08/19/2012 0440   CREATININE 0.48* 08/19/2012 0440   CALCIUM 9.3 08/19/2012 0440   PROT 7.1 08/19/2012 0440   ALBUMIN 3.3* 08/19/2012 0440   AST 62* 08/19/2012 0440   ALT 54* 08/19/2012 0440   ALKPHOS 73 08/19/2012 0440   BILITOT 0.5 08/19/2012 0440   GFRNONAA >90 08/19/2012 0440   GFRAA >90 08/19/2012 0440   Lipase     Component Value Date/Time   LIPASE 66* 08/18/2012 1234       Studies/Results: Dg Cholangiogram Operative  08/18/2012  *RADIOLOGY REPORT*  Clinical Data:   Cholecystitis  INTRAOPERATIVE CHOLANGIOGRAM  Technique:  Cholangiographic images from  the C-arm fluoroscopic device were submitted for interpretation post-operatively.  Please see the procedural report for the amount of contrast and the fluoroscopy time utilized.  Comparison:  CT 08/15/2012  Findings:  Multiple spot fluoroscopic intraoperative views are provided.  Initially image demonstrates cannulation of the cystic duct following cholecystectomy.  Injection of contrast opacifies the common hepatic duct and common bile duct without evidence of filling defect.  Contrast flows into the duodenum.  Small amount of contrast refluxes into the pancreatic duct.  IMPRESSION:  1.  No evidence of common bile duct stones or obstruction. 2.  No evidence of leak.   Original Report Authenticated By: Genevive Bi, M.D.     Anti-infectives: Anti-infectives     Start     Dose/Rate Route Frequency Ordered Stop   08/17/12 1045   Ampicillin-Sulbactam (UNASYN) 3 g in sodium chloride 0.9 % 100 mL IVPB     Comments: On call to OR      3 g 100 mL/hr over 60 Minutes Intravenous On call 08/17/12 1022 08/18/12 0922           Assessment/Plan  1. Gallstone pancreatitis 2. S/p lap chole  Plan: 1. Give regular diet, if tolerates she is stable for dc home 3. Follow up with [place] in 2-3 weeks   LOS: 5 days    Carrieann Spielberg E  08/19/2012, 8:30 AM Pager: [phone]

## 2012-08-19 NOTE — Discharge Summary (Signed)
Internal Medicine Teaching [facility] Discharge Note  Name: Mary Mcintosh MRN: [id number] DOB: 27-Jun-1991 21 y.o.  Date of Admission: 08/14/2012 11:45 PM Date of Discharge: 08/19/2012 Attending Physician: Farley Ly, MD  Discharge Diagnosis: Active Problems:  Gallstone pancreatitis  Status post laparoscopic cholecystectomy   Discharge Medications:   Medication List     As of 08/19/2012 12:24 PM    TAKE these medications         acetaminophen 325 MG tablet   Commonly known as: TYLENOL   Take 1-2 tablets (325-650 mg total) by mouth every 6 (six) hours as needed.        Disposition and follow-up:   Ms.Mary Mcintosh was discharged from [facility] in good condition.  Follow-up Appointments:     Follow-up Information    Follow up with Willodean Rosenthal, MD. On 08/30/2012. (on November 21st, 2013 at 1:45pm)    Contact information:   [facility] [address] [place] [address] [phone]       Follow up with Venita Lick. Russella Dar, MD,FACG. On 09/10/2012. (on 09/10/12 at 2pm)    Contact information:   [facility] [address] [place] [address] [phone]       Follow up with [facility]. On 09/04/2012. (3:30pm, arrive at 3:00pm)    Contact information:   [facility] [address] [place] [address]   [place] [address] [phone]         Discharge Orders    Future Appointments: Provider: Department: Dept Phone: Center:   08/30/2012 1:45 PM Willodean Rosenthal, MD [facility] [phone] [facility]   09/10/2012 2:00 PM Meryl Dare, MD,FACG [facility] [phone] [facility]     Future Orders Please Complete By Expires   Increase activity slowly      Discharge instructions      Comments:   Follow the instructions provided by the surgeons.  Keep the scheduled appointments.       Consultations: Treatment Team:  Md Montez Morita, MD [facility] Surgery  Gastroenterology  Procedures Performed:  Dg Cholangiogram Operative  08/18/2012  *RADIOLOGY REPORT*  Clinical Data:   Cholecystitis  INTRAOPERATIVE CHOLANGIOGRAM  Technique:  Cholangiographic images from the C-arm fluoroscopic device were submitted for interpretation post-operatively.  Please see the procedural report for the amount of contrast and the fluoroscopy time utilized.  Comparison:  CT 08/15/2012  Findings:  Multiple spot fluoroscopic intraoperative views are provided.  Initially image demonstrates cannulation of the cystic duct following cholecystectomy.  Injection of contrast opacifies the common hepatic duct and common bile duct without evidence of filling defect.  Contrast flows into the duodenum.  Small amount of contrast refluxes into the pancreatic duct.  IMPRESSION:  1.  No evidence of common bile duct stones or obstruction. 2.  No evidence of leak.   Original Report Authenticated By: Genevive Bi, M.D.    US Abdomen Complete  08/15/2012  *RADIOLOGY REPORT*  Clinical Data:  Right upper quadrant abdominal pain, elevated LFTs, leukocytosis and elevated lipase.  ABDOMINAL ULTRASOUND COMPLETE  Comparison:  None  Findings:  Gallbladder: Multiple stones of varying size are noted within the gallbladder, several which are adherent to the gallbladder wall. The gallbladder wall appears thickened, measuring 0.5 cm, but no significant pericholecystic fluid is seen, and no ultrasonographic Murphy's sign is elicited.  Common Bile Duct:  0.5 cm in diameter; within normal limits in caliber.  Liver:  Normal parenchymal echogenicity and echotexture; no focal lesions identified.  Limited Doppler evaluation demonstrates normal blood flow within the liver.  IVC:  Unremarkable in appearance.  Pancreas: There is question of trace fluid about the pancreas, though this is not well characterized on ultrasound.  Given the patient's  elevated lipase, pancreatitis is considered likely.  Spleen:  9.4 cm in length; within normal limits in size and echotexture.  Scattered small foci of increased echogenicity within the spleen likely reflect calcified granulomata, measuring up to 3 mm in size.  Right kidney:  10.2 cm in length; normal in size, configuration and parenchymal echogenicity.  No evidence of mass or hydronephrosis.  Left kidney:  11.0 cm in length; normal in size, configuration and parenchymal echogenicity.  No evidence of mass or hydronephrosis.  Abdominal Aorta:  Normal in caliber; no aneurysm identified.  IMPRESSION:  1.  Question of trace fluid about the pancreas.  Given the patient's elevated lipase, pancreatitis is considered likely. 2.  Cholelithiasis; apparent thickening of the gallbladder wall to 0.5 cm, without additional evidence to suggest cholecystitis.  No ultrasonographic Murphy's sign seen. 3.  Scattered calcified granulomata within the spleen.   Original Report Authenticated By: Tonia Ghent, M.D.    Ct Abdomen Pelvis W Contrast  08/15/2012  *RADIOLOGY REPORT*  Clinical Data: Abdominal pain, radiating to the back; nausea and vomiting.  CT ABDOMEN AND PELVIS WITH CONTRAST  Technique:  Multidetector CT imaging of the abdomen and pelvis was performed following the standard protocol during bolus administration of intravenous contrast.  Contrast: OMNIPAQUE IOHEXOL 300 MG/ML  SOLN  Comparison: Abdominal ultrasound performed earlier today at 01:44 a.m.  Findings: The visualized lung bases are clear.  There is fluid noted tracking about the entirety of the pancreas, extending inferiorly along Gerota's fascia bilaterally, more prominent on the left.  Associated soft tissue inflammation is noted.  Findings are compatible with acute pancreatitis.  There is no definite evidence of devascularization or pseudocyst formation, though the pancreas is difficult to fully assess due to motion artifact.  A small amount of  pericholecystic fluid is noted about the gallbladder.  This may be reactive in nature.  Known gallstones are not well characterized on CT.  There is no evidence of common hepatic duct dilatation to suggest obstruction.  The adrenal glands are unremarkable in appearance.  The spleen is within normal limits.  The liver is grossly unremarkable in appearance.  The kidneys are unremarkable in appearance.  There is no evidence of hydronephrosis.  No renal or ureteral stones are seen.  No perinephric stranding is appreciated.  No free fluid is identified.  The small bowel is unremarkable in appearance.  The stomach is within normal limits.  No acute vascular abnormalities are seen.  The appendix is not definitely seen; there is no evidence for appendicitis.  The colon is grossly unremarkable in appearance.  The bladder is moderately distended and grossly normal in appearance.  The patient's intrauterine device is noted in abnormally low position, with one of the prongs apparently extending into the myometrium.  The uterus is otherwise unremarkable in appearance.  The ovaries are relatively symmetric, though difficult to fully characterize; no suspicious adnexal masses are seen.  No inguinal lymphadenopathy is seen.  No acute osseous abnormalities are identified.  IMPRESSION:  1.  Acute pancreatitis noted, with fluid tracking about the entirety of the pancreas, and extending inferiorly along Gerota's fascia bilaterally, more prominent on the left.  Associated soft tissue inflammation seen.  No definite  evidence of devascularization or pseudocyst formation, though evaluation is mildly suboptimal due to motion artifact. 2.  Small amount of pericholecystic fluid about the gallbladder may be reactive in nature; known cholelithiasis not well characterized on CT.  No evidence to suggest obstruction. 3.  Intrauterine device noted in abnormally low position in the uterus, with one of the prongs apparently extending into the  myometrium.  Recommend clinical correlation and adjustment.   Original Report Authenticated By: Tonia Ghent, M.D.      Admission HPI: Patient is a 21 y.o. female with no known PMHx, who presents to [facility] for evaluation of 3 day history of intermittent, epigastric abdominal pain with radiation to the back that has gradually worsening, especially over the last 1 day (starting at [facility] on 11/5). This prompted her ER evaluation today.Pain is described as a burning epigastric pain that radiates to the back. She additionally experienced 3-4 episodes of vomiting of a nonbloody nonbilious vomitus. Aggravating factors include: drinking or drinking. Alleviating factors include: none. Associated symptoms include: constipation and dysuria. The patient denies chills, fever, hematochezia, hematuria and melena. Patient states she had similar (but less intense) episodes when she was 21 years old, for approximately a year, it was never determined the cause of these attacks, and eventually she grew out of it.  Otherwise, patient states she takes no new medications, no recent increased alcohol consumption, and no family or personal history of hypertriglyceridemia. She has never previously had abdominal sugeries.  Admission Physical Exam:  General:  Vital signs reviewed and noted. Well-developed, well-nourished, in no acute distress; alert, appropriate and cooperative throughout examination.   Head:  Normocephalic, atraumatic.   Eyes:  PERRL, EOMI, No signs of anemia or jaundince.   Nose:  Mucous membranes moist, not inflammed, nonerythematous.   Throat:  Oropharynx nonerythematous, no exudate appreciated.   Neck:  No deformities, masses, or tenderness noted.Supple, No carotid Bruits, no JVD.   Lungs:  Normal respiratory effort. Clear to auscultation BL without crackles or wheezes.   Heart:  RRR. S1 and S2 normal without gallop, murmur, or rubs.   Abdomen:  BS normoactive. Soft, Nondistended,. Epigastric tenderness to  palpation and RUQ, with negative Murphy sign No masses or organomegaly.   Extremities:  No pretibial edema.   Neurologic:  A&O X3, CN II - XII are grossly intact. Motor strength is 5/5 in the all 4 extremities, Sensations intact to light touch, Cerebellar signs negative.   Skin:  No visible rashes, scars.     Hospital Course by problem list:  1. Gallstone pancreatitis: Pancreatitis was suspected based on elevated lipase 3000, elevated transaminases, clinical history of 3 days of nausea, vomiting and abdominal pain; and confirmed with imaging. Imaging also showed cholelithiasis, gallbladder wall thickening of 0.5 cm without significant pericholecystic fluid and with a negative  ultrasonographic Murphy's sign. The patient was treated with bowel rest and IV fluid hydration. Laparoscopic cholecystectomy was conducted on 08/18/12. An intraoperative cholangiogram was normal without gallstone or obstruction in the common bile duct. The patient tolerated the surgery well. Diet was successfully advanced to solids. Since her surgery the patient's lipase has decreased from 3000 to 66, and transaminases are trending down with AST 62 (down from 84) and ALT stable at 54. Pain was effectively managed with minimal medication and the patient will continue Extra Strength Tylenol for pain as needed. --Repeat Lipase and LFT at follow-up appointment -- Continue Tylenol 650 mg as needed for pain --Per surgery recommendation the patient will return to work  in 2 weeks and the patient has been provided a note for her employer --Scheduled 2 week f/u with Surgery  Discharge Vitals:  BP 120/71  Pulse 95  Temp 98.4 F (36.9 C) (Oral)  Resp 16  Ht 5\' 1"  (1.549 m)  Wt 154 lb 15.7 oz (70.3 kg)  BMI 29.28 kg/m2  SpO2 98%  LMP 07/29/2012  Discharge Labs:  Results for orders placed during the hospital encounter of 08/14/12 (from the past 24 hour(s))  LIPASE, BLOOD     Status: Abnormal   Collection Time   08/18/12 12:34 PM        Component Value Range   Lipase 66 (*) 11 - 59 U/L  COMPREHENSIVE METABOLIC PANEL     Status: Abnormal   Collection Time   08/19/12  4:40 AM      Component Value Range   Sodium 139  135 - 145 mEq/L   Potassium 3.5  3.5 - 5.1 mEq/L   Chloride 102  96 - 112 mEq/L   CO2 25  19 - 32 mEq/L   Glucose, Bld 79  70 - 99 mg/dL   BUN 4 (*) 6 - 23 mg/dL   Creatinine, Ser 1.61 (*) 0.50 - 1.10 mg/dL   Calcium 9.3  8.4 - 09.6 mg/dL   Total Protein 7.1  6.0 - 8.3 g/dL   Albumin 3.3 (*) 3.5 - 5.2 g/dL   AST 62 (*) 0 - 37 U/L   ALT 54 (*) 0 - 35 U/L   Alkaline Phosphatase 73  39 - 117 U/L   Total Bilirubin 0.5  0.3 - 1.2 mg/dL   GFR calc non Af Amer >90  >90 mL/min   GFR calc Af Amer >90  >90 mL/min    Signed: Layne Dilauro 08/19/2012, 12:24 PM   Time Spent on Discharge: Services Ordered on Discharge: None Equipment Ordered on Discharge: None

## 2012-08-19 NOTE — Progress Notes (Signed)
Resident Addendum to Medical Student Note   I have seen and examined the patient, and agree with the the medical student assessment and plan outlined above. Please see my brief note below for additional details.  S: No acute events overnight, POD #1, no complaints of pain tolerated breakfast without nausea, vomiting or discomfort   OBJECTIVE: VS: Reviewed  Meds: Reviewed  Labs: Reviewed  Imaging: Reviewed   Physical Exam: General: Well-developed, well-nourished, in no acute distress; pleasant Lungs: Normal respiratory effort. Clear to auscultation bilaterally from apices to bases without crackles or wheezes appreciated. Heart: normal rate, regular rhythm, normal S1 and S2, no gallop, murmur, or rubs appreciated. Abdomen: BS normoactive. Soft, TTP, bandages c/d/i Extremities: No pretibial edema, distal pulses intact Neurologic: grossly non-focal, alert and oriented x3, appropriate and cooperative throughout examination.    ASSESSMENT/ PLAN: Pt is a 21 y.o. yo female with no significant medical history who was admitted on 08/14/2012 with symptoms of abdominal pain, which was determined to be secondary to gallstone pancreatitis.   1) Gallstone Pancreatitis: POD #1 s/p lap chole per Dr. Derrell Lolling of General Surgery, tolerated food w/o nausea, vomiting or pain -no complications -likely d/c home today with f/u at [facility], [facility], and [facility]   Length of Stay: 5   Kristie Cowman, MD PGY2, Internal Medicine Resident 08/19/2012, 12:29 PM

## 2012-08-19 NOTE — Progress Notes (Signed)
Patient ID: Mary Mcintosh, female   DOB: [birth year], 21 y.o.   MRN: [id number] edical Student Daily Progress Note   Subjective:    Interval Events:  No acute events overnight. The patient received Vicodin once at 6AM this morning for pain but no pain medications were needed overnight. She has been advanced by the Surgery team to soft foods this morning from clear liquids yesterday.  Subjectively the patient denies any abdominal pain, N/V/D, chills, chest pain, dyspnea, cough, dysuria. She has not had a bowel movement but has passed flatus. She reports she is feeling well and ready to go home. She did request a letter for work.    Objective:    Vital Signs:   Temp:  [98 F (36.7 C)-98.9 F (37.2 C)] 98.8 F (37.1 C) (11/10 0557) Pulse Rate:  [58-76] 71  (11/10 0557) Resp:  [9-18] 17  (11/10 0557) BP: (113-126)/(65-80) 123/66 mmHg (11/10 0557) SpO2:  [91 %-100 %] 98 % (11/10 0557) Weight:  [154 lb 15.7 oz (70.3 kg)] 154 lb 15.7 oz (70.3 kg) (11/10 0557) Last BM Date: 08/14/12   Weights: 24-hour Weight change: 7 lb 12.5 oz (3.53 kg)  Filed Weights   08/17/12 0521 08/18/12 0615 08/19/12 0557  Weight: 149 lb (67.586 kg) 147 lb 3.2 oz (66.769 kg) 154 lb 15.7 oz (70.3 kg)      Intake/Output:   Intake/Output Summary (Last 24 hours) at 08/19/12 0915 Last data filed at 08/19/12 0557  Gross per 24 hour  Intake   1460 ml  Output     12 ml  Net   1448 ml     24 hour UOP: 20.8 mL/kg/hr   Physical Exam: GENERAL: resting comfortably in bed and in no distress HEENT:  pupils equal, round, and reactive to light; sclera anicteric, moist mucosa LUNGS:  clear to auscultation bilaterally with no wheezes, rhonchi or rales, normal work of breathing HEART:  normal rate; regular rhythm; normal S1 and S2, no S3 or S4 appreciated; no murmurs, rubs, or gallops ABDOMEN:  +BS; soft; non-distended; RUQ and middle of lower abdomen mildly tender to palpation with no rebound or guarding; 4  incision sites are clean, dry and intact with no warmth, erythema or edema and mild tenderness to palpation EXTREMITIES:  No cyanosis, clubbing or edema, 2+ DP bilaterally    Labs: Basic Metabolic Panel:  Lab 08/19/12 4098 08/17/12 0833 08/16/12 0555 08/15/12 1055 08/15/12 0019  NA 139 141 139 -- 136  K 3.5 3.4* 3.8 -- 3.8  CL 102 107 106 -- 100  CO2 25 24 18* -- 26  GLUCOSE 79 82 55* -- 159*  BUN 4* 3* 9 -- 11  CREATININE 0.48* 0.52 0.53 0.55 0.63  CALCIUM 9.3 8.7 8.5 -- --  MG -- -- -- -- --  PHOS -- -- -- -- --    Liver Function Tests:  Lab 08/19/12 0440 08/17/12 0833 08/16/12 0555 08/15/12 0019  AST 62* 16 32 84*  ALT 54* 23 32 54*  ALKPHOS 73 71 81 92  BILITOT 0.5 0.5 0.6 0.8  PROT 7.1 6.4 6.6 8.0  ALBUMIN 3.3* 3.1* 3.2* 4.3    Lab 08/18/12 1234 08/17/12 0833 08/16/12 0555 08/15/12 0019  LIPASE 66* 80* 598* >3000*  AMYLASE -- -- -- --   No results found for this basename: AMMONIA:3 in the last 168 hours  CBC:  Lab 08/16/12 0555 08/15/12 1055 08/15/12 0019  WBC 12.9* 16.1* 21.2*  NEUTROABS -- -- 19.2*  HGB 11.5* 12.6  12.9  HCT 36.3 38.1 38.5  MCV 89.2 87.4 87.1  PLT 242 290 288    Cardiac Enzymes: No results found for this basename: CKTOTAL:5,CKMB:5,CKMBINDEX:5,TROPONINI:5 in the last 168 hours  BNP (last 3 results): No results found for this basename: PROBNP:3 in the last 8760 hours  CBG: No results found for this basename: GLUCAP:5 in the last 168 hours  Coagulation Studies: No results found for this basename: LABPROT:5,INR:5 in the last 72 hours  Microbiology: Results for orders placed during the hospital encounter of 03/09/11  MRSA PCR SCREENING     Status: Normal   Collection Time   03/10/11  1:31 PM      Component Value Range Status Comment   MRSA by PCR    NEGATIVE Final    Value: NEGATIVE            The GeneXpert MRSA Assay (FDA     approved for NASAL specimens     only), is one component of a     comprehensive MRSA colonization      surveillance program. It is not     intended to diagnose MRSA     infection nor to guide or     monitor treatment for     MRSA infections.    Other results:   Imaging: Dg Cholangiogram Operative  08/18/2012  *RADIOLOGY REPORT*  Clinical Data:   Cholecystitis  INTRAOPERATIVE CHOLANGIOGRAM  Technique:  Cholangiographic images from the C-arm fluoroscopic device were submitted for interpretation post-operatively.  Please see the procedural report for the amount of contrast and the fluoroscopy time utilized.  Comparison:  CT 08/15/2012  Findings:  Multiple spot fluoroscopic intraoperative views are provided.  Initially image demonstrates cannulation of the cystic duct following cholecystectomy.  Injection of contrast opacifies the common hepatic duct and common bile duct without evidence of filling defect.  Contrast flows into the duodenum.  Small amount of contrast refluxes into the pancreatic duct.  IMPRESSION:  1.  No evidence of common bile duct stones or obstruction. 2.  No evidence of leak.   Original Report Authenticated By: Genevive Bi, M.D.       Medications:    Infusions:    . [DISCONTINUED] sodium chloride 125 mL/hr at 08/18/12 0531     Scheduled Medications:    . [COMPLETED] ampicillin-sulbactam (UNASYN) IV  3 g Intravenous On Call  . antiseptic oral rinse  15 mL Mouth Rinse BID  . enoxaparin (LOVENOX) injection  40 mg Subcutaneous Q24H     PRN Medications: acetaminophen, acetaminophen, alum & mag hydroxide-simeth, bisacodyl, diphenhydrAMINE, fentaNYL, HYDROcodone-acetaminophen, lactated ringers, lip balm, magic mouthwash, magnesium hydroxide, ondansetron (ZOFRAN) IV, ondansetron, promethazine, [DISCONTINUED] acetaminophen, [DISCONTINUED] bupivacaine, [DISCONTINUED]  HYDROmorphone (DILAUDID) injection, [DISCONTINUED] Omnipaque 300 mg/mL (50 mL) in 0.9% normal saline (50 mL) [DISCONTINUED] ondansetron (ZOFRAN) IV, [DISCONTINUED] sodium chloride irrigation     Assessment/ Plan:    Pt is a 21 y/o woman with PMHx significant for preeclampsia admitted with nausea, vomiting, and abdominal pain for 3 days, lipase 3000, and imaging consistent with acute pancreatitis without obstruction, now on POD #1 s/p lap cholecystectomy.  1. Gallstone pancreatitis: Patient underwent Lap chole with IOC yesterday with Dr. Derrell Lolling. IOC was normal without gallstone or obstruction in CBD. Patient has tolerated the surgery well and diet has been advanced to soft solid food. --Surgery evaluated the patient this morning and advanced diet to soft solid foods. If diet tolerated she will be discharged later today.  --Repeat lipase is 66 (down from 3000 on  admission) --LFT are trending down with AST 62 (84) and ALT 54 (stable at 54) --Continue Tylenol 650 mg as needed for pain  2. Disposition: The patient will be discharged today if she is able to tolerate a soft solid diet. Surgery will schedule 2 week follow-up for the patient and provide the patient with a note for 2 week leave from work for post-operative recovery.  3. DVT PPx: Lovenox, SCD  4. Code Status: Full     Length of Stay: 5 days   This is a Psychologist, occupational Note.  The care of the patient was discussed with Dr. Bosie Clos assessment and plan formulated with their assistance.  Please see their attached note or addendum for official documentation of the daily encounter.

## 2012-08-20 ENCOUNTER — Encounter (HOSPITAL_COMMUNITY): Payer: Self-pay | Admitting: General Surgery

## 2012-08-30 ENCOUNTER — Ambulatory Visit: Payer: Self-pay | Admitting: Obstetrics & Gynecology

## 2012-09-04 ENCOUNTER — Telehealth: Payer: Self-pay | Admitting: Gastroenterology

## 2012-09-04 ENCOUNTER — Ambulatory Visit (INDEPENDENT_AMBULATORY_CARE_PROVIDER_SITE_OTHER): Payer: Self-pay | Admitting: General Surgery

## 2012-09-04 ENCOUNTER — Encounter (INDEPENDENT_AMBULATORY_CARE_PROVIDER_SITE_OTHER): Payer: Self-pay | Admitting: General Surgery

## 2012-09-04 VITALS — BP 102/76 | HR 59 | Temp 98.1°F | Ht 61.0 in | Wt 143.6 lb

## 2012-09-04 DIAGNOSIS — Z9889 Other specified postprocedural states: Secondary | ICD-10-CM

## 2012-09-04 DIAGNOSIS — K851 Biliary acute pancreatitis without necrosis or infection: Secondary | ICD-10-CM

## 2012-09-04 DIAGNOSIS — K859 Acute pancreatitis without necrosis or infection, unspecified: Secondary | ICD-10-CM

## 2012-09-04 NOTE — Telephone Encounter (Signed)
Pt scheduled to see Dr. Arlyce Dice 09/11/12 @10 :30am. Left message for pt to call back.

## 2012-09-04 NOTE — Progress Notes (Signed)
[facility] [place] [birth year] [id number] 09/04/2012   Mary Mcintosh is a 21 y.o. female who had a laparoscopic cholecystectomy with intraoperative cholangiogram by Dr. Derrell Lolling.  The pathology report confirmed chronic cholecystitis.  The patient reports that they are feeling well with normal bowel movements and good appetite.  The pre-operative symptoms of abdominal pain, nausea, and vomiting have resolved.    Physical examination - Incisions appear well-healed with no sign of infection or bleeding.   Abdomen - soft, non-tender  Impression:  s/p laparoscopic cholecystectomy  Plan:  She may resume a regular diet and full activity.  She may follow-up on a PRN basis.

## 2012-09-04 NOTE — Patient Instructions (Signed)
Follow up as needed

## 2012-09-10 ENCOUNTER — Ambulatory Visit: Payer: Self-pay | Admitting: Gastroenterology

## 2012-09-10 NOTE — Telephone Encounter (Signed)
Left message for pt regarding the appt with Dr. Arlyce Dice. Requested pt call back confirming the appt with Dr. Arlyce Dice.

## 2012-09-11 ENCOUNTER — Ambulatory Visit: Payer: Self-pay | Admitting: Gastroenterology

## 2012-10-10 NOTE — L&D Delivery Note (Signed)
Delivery Note Pt stated "the baby is coming" and sure enough, it was crowning.  After a 1 push 2nd stage, at  a viable female was delivered via  (Presentation:LOA;  ).  APGAR: 9/9 ; weight pending.   Placenta status:intact with 3 vessel  Cord:  with the following complications: none Anesthesia: Epidural  Episiotomy: none Lacerations: none Suture Repair: n/a Est. Blood Loss (mL): 250  Mom to postpartum.  Baby to Couplet care / Skin to Skin.  Delivery by Dr. Burnis Medin under my supervision. CRESENZO-DISHMAN,Londa Mackowski 08/29/2013, 8:32 PM

## 2012-10-10 NOTE — L&D Delivery Note (Signed)
Attestation of Attending Supervision of Advanced Practitioner (CNM/NP): Evaluation and management procedures were performed by the Advanced Practitioner under my supervision and collaboration. I have reviewed the Advanced Practitioner's note and chart, and I agree with the management and plan.  LEGGETT,KELLY H. 9:11 AM   

## 2013-04-15 ENCOUNTER — Other Ambulatory Visit (HOSPITAL_COMMUNITY): Payer: Self-pay | Admitting: Physician Assistant

## 2013-04-15 DIAGNOSIS — Z0489 Encounter for examination and observation for other specified reasons: Secondary | ICD-10-CM

## 2013-04-15 LAB — OB RESULTS CONSOLE RUBELLA ANTIBODY, IGM: Rubella: IMMUNE

## 2013-04-15 LAB — OB RESULTS CONSOLE ANTIBODY SCREEN: Antibody Screen: NEGATIVE

## 2013-04-15 LAB — OB RESULTS CONSOLE RPR: RPR: NONREACTIVE

## 2013-04-15 LAB — OB RESULTS CONSOLE ABO/RH

## 2013-04-19 ENCOUNTER — Encounter (HOSPITAL_COMMUNITY): Payer: Self-pay

## 2013-04-19 ENCOUNTER — Ambulatory Visit (HOSPITAL_COMMUNITY)
Admission: RE | Admit: 2013-04-19 | Discharge: 2013-04-19 | Disposition: A | Discharge status: Home / Self Care | Payer: Self-pay | Source: Ambulatory Visit | Attending: Physician Assistant | Admitting: Physician Assistant

## 2013-04-19 DIAGNOSIS — Z0489 Encounter for examination and observation for other specified reasons: Secondary | ICD-10-CM

## 2013-04-19 DIAGNOSIS — Z363 Encounter for antenatal screening for malformations: Secondary | ICD-10-CM | POA: Insufficient documentation

## 2013-04-19 DIAGNOSIS — Z1389 Encounter for screening for other disorder: Secondary | ICD-10-CM | POA: Insufficient documentation

## 2013-04-19 DIAGNOSIS — O358XX0 Maternal care for other (suspected) fetal abnormality and damage, not applicable or unspecified: Secondary | ICD-10-CM | POA: Insufficient documentation

## 2013-08-01 LAB — OB RESULTS CONSOLE GBS: GBS: NEGATIVE

## 2013-08-26 ENCOUNTER — Other Ambulatory Visit (HOSPITAL_COMMUNITY): Payer: Self-pay | Admitting: Nurse Practitioner

## 2013-08-26 DIAGNOSIS — O480 Post-term pregnancy: Secondary | ICD-10-CM

## 2013-08-26 DIAGNOSIS — O288 Other abnormal findings on antenatal screening of mother: Secondary | ICD-10-CM

## 2013-08-29 ENCOUNTER — Encounter (HOSPITAL_COMMUNITY): Payer: Medicaid Other | Admitting: Anesthesiology

## 2013-08-29 ENCOUNTER — Inpatient Hospital Stay (HOSPITAL_COMMUNITY)
Admission: AD | Admit: 2013-08-29 | Discharge: 2013-08-31 | DRG: 775 | Disposition: A | Discharge status: Home / Self Care | Payer: Medicaid Other | Source: Ambulatory Visit | Attending: Obstetrics & Gynecology | Admitting: Obstetrics & Gynecology

## 2013-08-29 ENCOUNTER — Encounter (HOSPITAL_COMMUNITY): Payer: Self-pay | Admitting: *Deleted

## 2013-08-29 ENCOUNTER — Observation Stay (HOSPITAL_COMMUNITY): Payer: Medicaid Other | Admitting: Anesthesiology

## 2013-08-29 DIAGNOSIS — IMO0001 Reserved for inherently not codable concepts without codable children: Secondary | ICD-10-CM

## 2013-08-29 DIAGNOSIS — Z87891 Personal history of nicotine dependence: Secondary | ICD-10-CM

## 2013-08-29 LAB — CBC
HCT: 37.8 % (ref 36.0–46.0)
Hemoglobin: 13 g/dL (ref 12.0–15.0)
MCH: 30.4 pg (ref 26.0–34.0)
MCH: 30.6 pg (ref 26.0–34.0)
MCHC: 34.5 g/dL (ref 30.0–36.0)
MCV: 88.5 fL (ref 78.0–100.0)
MCV: 88.7 fL (ref 78.0–100.0)
Platelets: 212 10*3/uL (ref 150–400)
RBC: 4.27 MIL/uL (ref 3.87–5.11)
RBC: 4.44 MIL/uL (ref 3.87–5.11)

## 2013-08-29 LAB — TYPE AND SCREEN: ABO/RH(D): A POS

## 2013-08-29 LAB — COMPREHENSIVE METABOLIC PANEL
ALT: 12 U/L (ref 0–35)
BUN: 9 mg/dL (ref 6–23)
CO2: 21 mEq/L (ref 19–32)
Calcium: 9.6 mg/dL (ref 8.4–10.5)
Creatinine, Ser: 0.5 mg/dL (ref 0.50–1.10)
GFR calc Af Amer: >90 mL/min (ref >90)
GFR calc non Af Amer: >90 mL/min (ref >90)
Glucose, Bld: 85 mg/dL (ref 70–99)
Total Protein: 6.8 g/dL (ref 6.0–8.3)

## 2013-08-29 LAB — RPR: RPR Ser Ql: NONREACTIVE

## 2013-08-29 LAB — PROTEIN / CREATININE RATIO, URINE
Creatinine, Urine: 93.34 mg/dL
Protein Creatinine Ratio: 0.1 (ref 0.00–0.15)
Total Protein, Urine: 9.7 mg/dL

## 2013-08-29 LAB — ABO/RH: ABO/RH(D): A POS

## 2013-08-29 MED ORDER — FLEET ENEMA 7-19 GM/118ML RE ENEM: 1.0000 | ENEMA | RECTAL | Status: DC | PRN

## 2013-08-29 MED ORDER — EPHEDRINE 5 MG/ML INJ: 10.0000 mg | INTRAVENOUS | Status: DC | PRN

## 2013-08-29 MED ORDER — LACTATED RINGERS IV SOLN
INTRAVENOUS | Status: DC
  Administered 2013-08-29: 17:00:00 via INTRAVENOUS

## 2013-08-29 MED ORDER — OXYCODONE-ACETAMINOPHEN 5-325 MG PO TABS: 1.0000 | ORAL_TABLET | ORAL | Status: DC | PRN

## 2013-08-29 MED ORDER — CITRIC ACID-SODIUM CITRATE 334-500 MG/5ML PO SOLN: 30.0000 mL | ORAL | Status: DC | PRN

## 2013-08-29 MED ORDER — IBUPROFEN 600 MG PO TABS
600.0000 mg | ORAL_TABLET | Freq: Four times a day (QID) | ORAL | Status: DC
  Administered 2013-08-29 – 2013-08-31 (×5): 600 mg via ORAL
  Filled 2013-08-29 (×7): qty 1

## 2013-08-29 MED ORDER — DIPHENHYDRAMINE HCL 25 MG PO CAPS: 25.0000 mg | ORAL_CAPSULE | Freq: Four times a day (QID) | ORAL | Status: DC | PRN

## 2013-08-29 MED ORDER — LANOLIN HYDROUS EX OINT: TOPICAL_OINTMENT | CUTANEOUS | Status: DC | PRN

## 2013-08-29 MED ORDER — LIDOCAINE HCL (PF) 1 % IJ SOLN
30.0000 mL | INTRAMUSCULAR | Status: DC | PRN
  Filled 2013-08-29: qty 30

## 2013-08-29 MED ORDER — ONDANSETRON HCL 4 MG/2ML IJ SOLN: 4.0000 mg | INTRAMUSCULAR | Status: DC | PRN

## 2013-08-29 MED ORDER — PHENYLEPHRINE 40 MCG/ML (10ML) SYRINGE FOR IV PUSH (FOR BLOOD PRESSURE SUPPORT): 80.0000 ug | PREFILLED_SYRINGE | INTRAVENOUS | Status: DC | PRN

## 2013-08-29 MED ORDER — METHYLERGONOVINE MALEATE 0.2 MG/ML IJ SOLN: 0.2000 mg | INTRAMUSCULAR | Status: DC | PRN

## 2013-08-29 MED ORDER — ONDANSETRON HCL 4 MG/2ML IJ SOLN: 4.0000 mg | Freq: Four times a day (QID) | INTRAMUSCULAR | Status: DC | PRN

## 2013-08-29 MED ORDER — METHYLERGONOVINE MALEATE 0.2 MG PO TABS: 0.2000 mg | ORAL_TABLET | ORAL | Status: DC | PRN

## 2013-08-29 MED ORDER — EPHEDRINE 5 MG/ML INJ
10.0000 mg | INTRAVENOUS | Status: DC | PRN
  Filled 2013-08-29: qty 4

## 2013-08-29 MED ORDER — OXYTOCIN BOLUS FROM INFUSION
500.0000 mL | INTRAVENOUS | Status: DC
  Administered 2013-08-29: 500 mL via INTRAVENOUS

## 2013-08-29 MED ORDER — ZOLPIDEM TARTRATE 5 MG PO TABS: 5.0000 mg | ORAL_TABLET | Freq: Every evening | ORAL | Status: DC | PRN

## 2013-08-29 MED ORDER — DIPHENHYDRAMINE HCL 50 MG/ML IJ SOLN: 12.5000 mg | INTRAMUSCULAR | Status: DC | PRN

## 2013-08-29 MED ORDER — WITCH HAZEL-GLYCERIN EX PADS: 1.0000 "application " | MEDICATED_PAD | CUTANEOUS | Status: DC | PRN

## 2013-08-29 MED ORDER — BENZOCAINE-MENTHOL 20-0.5 % EX AERO: 1.0000 "application " | INHALATION_SPRAY | CUTANEOUS | Status: DC | PRN

## 2013-08-29 MED ORDER — SENNOSIDES-DOCUSATE SODIUM 8.6-50 MG PO TABS
2.0000 | ORAL_TABLET | ORAL | Status: DC
  Administered 2013-08-29: 2 via ORAL
  Filled 2013-08-29 (×2): qty 2

## 2013-08-29 MED ORDER — FENTANYL 2.5 MCG/ML BUPIVACAINE 1/10 % EPIDURAL INFUSION (WH - ANES)
INTRAMUSCULAR | Status: DC | PRN
  Administered 2013-08-29: 12 mL/h via EPIDURAL

## 2013-08-29 MED ORDER — IBUPROFEN 600 MG PO TABS: 600.0000 mg | ORAL_TABLET | Freq: Four times a day (QID) | ORAL | Status: DC | PRN

## 2013-08-29 MED ORDER — FERROUS SULFATE 325 (65 FE) MG PO TABS
325.0000 mg | ORAL_TABLET | Freq: Two times a day (BID) | ORAL | Status: DC
  Administered 2013-08-30 – 2013-08-31 (×3): 325 mg via ORAL
  Filled 2013-08-29 (×3): qty 1

## 2013-08-29 MED ORDER — PHENYLEPHRINE 40 MCG/ML (10ML) SYRINGE FOR IV PUSH (FOR BLOOD PRESSURE SUPPORT)
80.0000 ug | PREFILLED_SYRINGE | INTRAVENOUS | Status: DC | PRN
  Filled 2013-08-29: qty 10

## 2013-08-29 MED ORDER — LACTATED RINGERS IV SOLN: 500.0000 mL | INTRAVENOUS | Status: DC | PRN

## 2013-08-29 MED ORDER — ACETAMINOPHEN 325 MG PO TABS: 650.0000 mg | ORAL_TABLET | ORAL | Status: DC | PRN

## 2013-08-29 MED ORDER — FENTANYL 2.5 MCG/ML BUPIVACAINE 1/10 % EPIDURAL INFUSION (WH - ANES)
14.0000 mL/h | INTRAMUSCULAR | Status: DC | PRN
  Filled 2013-08-29: qty 125

## 2013-08-29 MED ORDER — DIBUCAINE 1 % RE OINT: 1.0000 "application " | TOPICAL_OINTMENT | RECTAL | Status: DC | PRN

## 2013-08-29 MED ORDER — OXYTOCIN 40 UNITS IN LACTATED RINGERS INFUSION - SIMPLE MED: 62.5000 mL/h | INTRAVENOUS | Status: DC | PRN

## 2013-08-29 MED ORDER — SIMETHICONE 80 MG PO CHEW: 80.0000 mg | CHEWABLE_TABLET | ORAL | Status: DC | PRN

## 2013-08-29 MED ORDER — BISACODYL 10 MG RE SUPP: 10.0000 mg | Freq: Every day | RECTAL | Status: DC | PRN

## 2013-08-29 MED ORDER — FLEET ENEMA 7-19 GM/118ML RE ENEM: 1.0000 | ENEMA | Freq: Every day | RECTAL | Status: DC | PRN

## 2013-08-29 MED ORDER — PRENATAL MULTIVITAMIN CH
1.0000 | ORAL_TABLET | Freq: Every day | ORAL | Status: DC
  Administered 2013-08-29 – 2013-08-31 (×3): 1 via ORAL
  Filled 2013-08-29 (×3): qty 1

## 2013-08-29 MED ORDER — OXYTOCIN 40 UNITS IN LACTATED RINGERS INFUSION - SIMPLE MED
62.5000 mL/h | INTRAVENOUS | Status: DC
  Administered 2013-08-29: 62.5 mL/h via INTRAVENOUS
  Filled 2013-08-29: qty 1000

## 2013-08-29 MED ORDER — LIDOCAINE HCL (PF) 1 % IJ SOLN
INTRAMUSCULAR | Status: DC | PRN
  Administered 2013-08-29 (×2): 4 mL

## 2013-08-29 MED ORDER — ONDANSETRON HCL 4 MG PO TABS: 4.0000 mg | ORAL_TABLET | ORAL | Status: DC | PRN

## 2013-08-29 MED ORDER — TETANUS-DIPHTH-ACELL PERTUSSIS 5-2.5-18.5 LF-MCG/0.5 IM SUSP: 0.5000 mL | Freq: Once | INTRAMUSCULAR | Status: DC

## 2013-08-29 MED ORDER — MEASLES, MUMPS & RUBELLA VAC ~~LOC~~ INJ: 0.5000 mL | INJECTION | Freq: Once | SUBCUTANEOUS | Status: DC

## 2013-08-29 MED ORDER — LACTATED RINGERS IV SOLN: 500.0000 mL | Freq: Once | INTRAVENOUS | Status: DC

## 2013-08-29 NOTE — Anesthesia Preprocedure Evaluation (Signed)
Anesthesia Evaluation    Airway Mallampati: III TM Distance: >3 FB Neck ROM: Full    Dental no notable dental hx. (+) Teeth Intact   Pulmonary former smoker,  breath sounds clear to auscultation  Pulmonary exam normal       Cardiovascular hypertension, + Peripheral Vascular Disease Rhythm:Regular Rate:Normal     Neuro/Psych  Headaches, PSYCHIATRIC DISORDERS Anxiety Depression    GI/Hepatic Neg liver ROS, GERD-  ,Hx/o gallstone pancreatitis   Endo/Other  negative endocrine ROS  Renal/GU negative Renal ROS  negative genitourinary   Musculoskeletal negative musculoskeletal ROS (+)   Abdominal   Peds  Hematology negative hematology ROS (+)   Anesthesia Other Findings   Reproductive/Obstetrics (+) Pregnancy                           Anesthesia Physical Anesthesia Plan  ASA: II  Anesthesia Plan: Epidural   Post-op Pain Management:    Induction:   Airway Management Planned: Natural Airway  Additional Equipment:   Intra-op Plan:   Post-operative Plan:   Informed Consent: I have reviewed the patients History and Physical, chart, labs and discussed the procedure including the risks, benefits and alternatives for the proposed anesthesia with the patient or authorized representative who has indicated his/her understanding and acceptance.     Plan Discussed with: Anesthesiologist  Anesthesia Plan Comments:         Anesthesia Quick Evaluation

## 2013-08-29 NOTE — MAU Note (Signed)
Pt reports she has had ctx since lst night. Reports they are q 8-10 min now. Reprots some bloody show and good fetal movment.

## 2013-08-29 NOTE — Progress Notes (Signed)
   Mary Mcintosh is a 22 y.o. G2P1001 at [redacted]w[redacted]d  admitted for active labor  Subjective: Comfortable with epidural  Objective: BP 127/70  Pulse 82  Temp(Src) 99.2 F (37.3 C) (Oral)  Resp 16  Ht 5' (1.524 m)  Wt 71.215 kg (157 lb)  BMI 30.66 kg/m2    FHT:  FHR: 140 bpm, variability: moderate,  accelerations:  Present,  decelerations:  Absent UC:   regular, every 2 minutes SVE:   Dilation: 7 Effacement (%): 100 Station: 0 Exam by:: Cresenzo-Dishmon CNM  AROM with clear fluid Labs: Lab Results  Component Value Date   WBC 17.4* 08/29/2013   HGB 13.6 08/29/2013   HCT 39.4 08/29/2013   MCV 88.7 08/29/2013   PLT 212 08/29/2013    Assessment / Plan: Spontaneous labor, progressing normally  Labor: Progressing normally Fetal Wellbeing:  Category I Pain Control:  Epidural Anticipated MOD:  NSVD  CRESENZO-DISHMAN,Yona Kosek 08/29/2013, 6:42 PM

## 2013-08-29 NOTE — Anesthesia Procedure Notes (Signed)
Epidural Patient location during procedure: OB Start time: 08/29/2013 5:46 PM  Staffing Anesthesiologist: Jodee Wagenaar A. Performed by: anesthesiologist   Preanesthetic Checklist Completed: patient identified, site marked, surgical consent, pre-op evaluation, timeout performed, IV checked, risks and benefits discussed and monitors and equipment checked  Epidural Patient position: sitting Prep: site prepped and draped and DuraPrep Patient monitoring: continuous pulse ox and blood pressure Approach: midline Injection technique: LOR air  Needle:  Needle type: Tuohy  Needle gauge: 17 G Needle length: 9 cm and 9 Needle insertion depth: 5 cm cm Catheter type: closed end flexible Catheter size: 19 Gauge Catheter at skin depth: 10 cm Test dose: negative and Other  Assessment Events: blood not aspirated, injection not painful, no injection resistance, negative IV test and no paresthesia  Additional Notes Patient identified. Risks and benefits discussed including failed block, incomplete  Pain control, post dural puncture headache, nerve damage, paralysis, blood pressure Changes, nausea, vomiting, reactions to medications-both toxic and allergic and post Partum back pain. All questions were answered. Patient expressed understanding and wished to proceed. Sterile technique was used throughout procedure. Epidural site was Dressed with sterile barrier dressing. No paresthesias, signs of intravascular injection Or signs of intrathecal spread were encountered.  Patient was more comfortable after the epidural was dosed. Please see RN's note for documentation of vital signs and FHR which are stable.

## 2013-08-29 NOTE — H&P (Signed)
Mary Mcintosh is a 22 y.o. female at [redacted]w[redacted]d presenting with contractions.   Contractions began earlier this morning, at first mild and now with severe pain. No LOF, vag. Bleeding/discharge. Good fetal movements. She has received [facility]. Pregnancy is complicated only by an abnormal 1-hour GTT followed by a normal 3-hour GTT.  History OB History   Grav Para Term Preterm Abortions TAB SAB Ect Mult Living   2 1 1       1      G1: NSVD at term.   Past Medical History  Diagnosis Date  . Pancreatitis 08/15/2012  . Pre-eclampsia 2012    with last child   . Varicose veins of both lower extremities with pain     "when I work" (08/15/2012)  . Swelling of lower limb     "both legs; when I work" (08/15/2012)  . Migraines     "I think they're migraines, I get them often" (08/15/2012)  . Anxiety ~ 2009    "once; only for a short time" (08/15/2012)  . Depression ~ 2009    "once; only for a short time" (08/15/2012)   Past Surgical History  Procedure Laterality Date  . No past surgeries    . Cholecystectomy  08/18/2012    Procedure: LAPAROSCOPIC CHOLECYSTECTOMY WITH INTRAOPERATIVE CHOLANGIOGRAM;  Surgeon: Axel Filler, MD;  Location: [facility] OR;  Service: General;  Laterality: N/A;   Family History: family history is not on file. Social History:  reports that she has quit smoking. Her smoking use included Cigarettes. She smoked 0.00 packs per day. She has never used smokeless tobacco. She reports that she drinks alcohol. She reports that she does not use illicit drugs.   Prenatal Transfer Tool  Maternal Diabetes: No Genetic Screening: Normal Maternal Ultrasounds/Referrals: Normal Fetal Ultrasounds or other Referrals:  None Maternal Substance Abuse:  No Significant Maternal Medications:  None Significant Maternal Lab Results:  None Other Comments:  None  ROS  Dilation: 5 Effacement (%): 90 Station: -1 Exam by:: Dorrene German RN, Dr. Jarvis Newcomer Blood pressure 115/60, pulse 93, temperature 99  F (37.2 C), temperature source Oral, resp. rate 18, height 5' (1.524 m), weight 71.215 kg (157 lb).  FHT: baseline 135-140s, moderate variability, 15x15 accels, no decels. Toco: Intermittent contractions.   Maternal Exam:  Introitus: Vulva is negative for lesion.  Vagina is negative for discharge.    Physical Exam  Constitutional: She is oriented to person, place, and time. She appears well-developed and well-nourished.  HENT:  Head: Normocephalic.  Mouth/Throat: Oropharynx is clear and moist.  Eyes: EOM are normal. Pupils are equal, round, and reactive to light.  Neck: Normal range of motion. Neck supple.  Cardiovascular: Normal rate, regular rhythm and normal heart sounds.   Respiratory: Effort normal and breath sounds normal. No respiratory distress.  GI: Soft. There is no tenderness.  Genitourinary: Vulva exhibits no lesion. No vaginal discharge found.  Musculoskeletal: Normal range of motion.  Neurological: She is alert and oriented to person, place, and time.  Skin: Skin is warm.    Prenatal labs: ABO, Rh:  A POS Antibody:  NEG Rubella:  Immune RPR:   Neg HBsAg:   Neg HIV:   Neg GBS:   Neg  Assessment/Plan: [name] is a 22 y.o. G2P1001 at [redacted]w[redacted]d presenting after onset of labor.   Expectant management, anticipate NSVD. Desires epidural. Planning on nexplanon post-partum.   [name] 08/29/2013, 3:58 PM  Evaluation and management procedures were performed by Resident physician under my supervision/collaboration.  Chart reviewed, patient examined by me and I agree with management and plan. 1 hr 147, 3 hr normal. Danae Orleans, CNM 08/29/2013 6:27 PM

## 2013-08-30 ENCOUNTER — Ambulatory Visit (HOSPITAL_COMMUNITY): Admission: RE | Admit: 2013-08-30 | Payer: Medicaid Other | Source: Ambulatory Visit

## 2013-08-30 NOTE — Progress Notes (Signed)
Spanish interpreter paged to come to room to make sure Mom didn't need any help ordering breakfast, and to see if she had any questions.  Paperwork gone over again with spanish interpreter, breakfast ordered.

## 2013-08-30 NOTE — Progress Notes (Signed)
UR chart review completed.  

## 2013-08-30 NOTE — Progress Notes (Signed)
Offered interpreter services upon admission.  Mom & significant other declined at this time.  Asked Mom if she would prefer her paperwork in [place] or [place], she replied Spanish.  Spanish packet provided.  Informed Mom that we have Spanish interpretation services available at all times.  Mom declined the need for them at this time.

## 2013-08-30 NOTE — Anesthesia Postprocedure Evaluation (Signed)
  Anesthesia Post Note  Patient: Mary Mcintosh  Procedure(s) Performed: * No procedures listed *  Anesthesia type: Epidural  Patient location: Mother/Baby  Post pain: Pain level controlled  Post assessment: Post-op Vital signs reviewed  Last Vitals:  Filed Vitals:   08/30/13 1758  BP: 121/69  Pulse: 82  Temp: 36.7 C  Resp: 18    Post vital signs: Reviewed  Level of consciousness:alert  Complications: No apparent anesthesia complications

## 2013-08-30 NOTE — Progress Notes (Signed)
Patient was referred for history of depression/anxiety. * Referral screened out by Clinical Social Worker because none of the following criteria appear to apply: ~ History of anxiety/depression during this pregnancy, or of post-partum depression. ~ Diagnosis of anxiety and/or depression within last 3 years ~ History of depression due to pregnancy loss/loss of child OR * Patient's symptoms currently being treated with medication and/or therapy. Please contact the Clinical Social Worker if needs arise, or if patient requests.  H&P notes hx of Anxiety and Depression "once; only for a short time" in 2009.

## 2013-08-30 NOTE — Progress Notes (Signed)
Post Partum Day 1 Subjective: Pt seen at bedside, no complaints, up ad lib, voiding, tolerating PO and + flatus. No BM yet.   Objective: Blood pressure 117/64, pulse 78, temperature 98.4 F (36.9 C), temperature source Oral, resp. rate 16, height 5' (1.524 m), weight 71.215 kg (157 lb), SpO2 97.00%, unknown if currently breastfeeding.  Physical Exam:  General: alert, cooperative and no distress Lochia: appropriate Uterine Fundus: firm Incision: n/a DVT Evaluation: No evidence of DVT seen on physical exam. Negative Homan's sign. No cords or calf tenderness. No significant calf/ankle edema.   Recent Labs  08/29/13 1351 08/29/13 1645  HGB 13.0 13.6  HCT 37.8 39.4    Assessment/Plan: Plan for discharge tomorrow, Breastfeeding and Lactation consult. Wants Nexplanon for birth control.   LOS: 1 day   Bobbye Morton, MD PGY-2, [facility] Family Medicine 08/30/2013, 6:31 AM   I have seen and examined this patient and agree with above documentation in the resident's note. Plan for d/c tomorrow.   Rulon Abide, M.D. [facility] Fellow 08/30/2013 10:35 AM

## 2013-08-31 MED ORDER — IBUPROFEN 600 MG PO TABS: 600.0000 mg | ORAL_TABLET | Freq: Four times a day (QID) | ORAL | Status: DC

## 2013-08-31 NOTE — Discharge Summary (Signed)
Obstetric Discharge Summary Reason for Admission: onset of labor Prenatal Procedures: none Intrapartum Procedures: spontaneous vaginal delivery Postpartum Procedures: none Complications-Operative and Postpartum: none Hemoglobin  Date Value Range Status  08/29/2013 13.6  12.0 - 15.0 g/dL Final     HCT  Date Value Range Status  08/29/2013 39.4  36.0 - 46.0 % Final   Hospital Course: Admitted for onset of labor. Uncomplicated delivery and postpartum course. Baby doing well.  Physical Exam:  General: alert, cooperative and no distress Lochia: appropriate Uterine Fundus: firm DVT Evaluation: No evidence of DVT seen on physical exam. Negative Homan's sign. No cords or calf tenderness. No significant calf/ankle edema.  Discharge Diagnoses: Term Pregnancy-delivered  Discharge Information: Date: 08/31/2013 Activity: pelvic rest Diet: routine Medications: PNV and Ibuprofen Condition: stable Instructions: refer to practice specific booklet Discharge to: home Follow-up Information   Follow up with [facility] [place] In 6 weeks. (For postpartum follow-up)    Contact information:   [address] [place] [address] [phone]      Newborn Data: Live born female  Birth Weight: 6 lb 11.6 oz (3050 g) APGAR: 9, 9  Home with mother. Breast feeding. Mother planning Nexplanon.  Bobbye Morton, MD PGY-2, [facility] Family Medicine 08/31/2013, 9:00 AM

## 2013-08-31 NOTE — Progress Notes (Signed)
Discharge teaching and follow-up instructions done with interpreter. 

## 2013-08-31 NOTE — Discharge Summary (Signed)
Attestation of Attending Supervision of Resident: Evaluation and management procedures were performed by the Family Medicine Resident under my supervision.  I have seen and examined the patient, reviewed the resident's note and chart, and I agree with the management and plan.  Reesa Gotschall, MD, FACOG Attending Obstetrician & Gynecologist Faculty Practice, [facility] 

## 2013-09-01 NOTE — H&P (Signed)
Attestation of Attending Supervision of Advanced Practitioner (CNM/NP): Evaluation and management procedures were performed by the Advanced Practitioner under my supervision and collaboration. I have reviewed the Advanced Practitioner's note and chart, and I agree with the management and plan.  Keshauna Degraffenreid H. 2:12 PM   

## 2013-09-06 ENCOUNTER — Inpatient Hospital Stay (HOSPITAL_COMMUNITY): Admission: RE | Admit: 2013-09-06 | Payer: Medicaid Other | Source: Ambulatory Visit

## 2014-08-11 ENCOUNTER — Encounter (HOSPITAL_COMMUNITY): Payer: Self-pay | Admitting: *Deleted

## 2018-11-14 ENCOUNTER — Emergency Department (HOSPITAL_COMMUNITY)
Admission: EM | Admit: 2018-11-14 | Discharge: 2018-11-14 | Disposition: A | Discharge status: Home / Self Care | Payer: Self-pay | Attending: Emergency Medicine | Admitting: Emergency Medicine

## 2018-11-14 ENCOUNTER — Other Ambulatory Visit: Payer: Self-pay

## 2018-11-14 ENCOUNTER — Emergency Department (HOSPITAL_COMMUNITY): Payer: Self-pay

## 2018-11-14 ENCOUNTER — Encounter (HOSPITAL_COMMUNITY): Payer: Self-pay | Admitting: *Deleted

## 2018-11-14 DIAGNOSIS — M7918 Myalgia, other site: Secondary | ICD-10-CM | POA: Insufficient documentation

## 2018-11-14 DIAGNOSIS — R0602 Shortness of breath: Secondary | ICD-10-CM | POA: Insufficient documentation

## 2018-11-14 DIAGNOSIS — R42 Dizziness and giddiness: Secondary | ICD-10-CM | POA: Insufficient documentation

## 2018-11-14 DIAGNOSIS — R51 Headache: Secondary | ICD-10-CM | POA: Insufficient documentation

## 2018-11-14 DIAGNOSIS — J111 Influenza due to unidentified influenza virus with other respiratory manifestations: Secondary | ICD-10-CM | POA: Insufficient documentation

## 2018-11-14 DIAGNOSIS — R0981 Nasal congestion: Secondary | ICD-10-CM | POA: Insufficient documentation

## 2018-11-14 DIAGNOSIS — R69 Illness, unspecified: Secondary | ICD-10-CM

## 2018-11-14 LAB — BASIC METABOLIC PANEL
Anion gap: 9 (ref 5–15)
BUN: 6 mg/dL (ref 6–20)
CALCIUM: 9 mg/dL (ref 8.9–10.3)
CO2: 24 mmol/L (ref 22–32)
CREATININE: 0.65 mg/dL (ref 0.44–1.00)
Chloride: 108 mmol/L (ref 98–111)
GFR calc Af Amer: >60 mL/min (ref >60)
GFR calc non Af Amer: >60 mL/min (ref >60)
Glucose, Bld: 118 mg/dL — ABNORMAL HIGH (ref 70–99)
Potassium: 3.3 mmol/L — ABNORMAL LOW (ref 3.5–5.1)
Sodium: 141 mmol/L (ref 135–145)

## 2018-11-14 LAB — CBC
HCT: 39.2 % (ref 36.0–46.0)
Hemoglobin: 12.3 g/dL (ref 12.0–15.0)
MCH: 28.9 pg (ref 26.0–34.0)
MCHC: 31.4 g/dL (ref 30.0–36.0)
MCV: 92 fL (ref 80.0–100.0)
PLATELETS: 165 10*3/uL (ref 150–400)
RBC: 4.26 MIL/uL (ref 3.87–5.11)
RDW: 11.9 % (ref 11.5–15.5)
WBC: 4.9 10*3/uL (ref 4.0–10.5)
nRBC: 0 % (ref 0.0–0.2)

## 2018-11-14 MED ORDER — SODIUM CHLORIDE 0.9% FLUSH: 3.0000 mL | Freq: Once | INTRAVENOUS | Status: DC

## 2018-11-14 MED ORDER — ONDANSETRON 4 MG PO TBDP: 4.0000 mg | ORAL_TABLET | Freq: Three times a day (TID) | ORAL | 0 refills | Status: DC | PRN

## 2018-11-14 NOTE — ED Triage Notes (Signed)
Per use of Spanish interpreter, pt has been feeling like her heart is racing, has pain behind her eyes,and bodyaches since Friday. Tonight, pt started having increased sob with coughing episodes making it hard for her to catch her breath.

## 2018-11-14 NOTE — ED Provider Notes (Signed)
[facility] EMERGENCY DEPARTMENT Provider Note   CSN: [id number] Arrival date & time: 11/14/18  0040   History   Chief Complaint No chief complaint on file.   HPI Mary Mcintosh is a 28 y.o. female with no significant past medical history is here for evaluation of flulike symptoms.  Onset suddenly last Thursday, persistent.  Associated symptoms include nasal congestion, chest congestion, dry cough, body aches, headaches, subjective fevers. Yesterday she noticed lightheadedness and "shortness of breath" which concerned her and came to the ER.  Describes shortness of breath as "chest is congested" and worse with forceful prolonged coughing, this is brief, occurs at rest, nonexertional.  Has been taken over-the-counter antipyretics.  No alleviating factors.  She denies associated vision changes, neck pain or stiffness, chest pain or shortness of breath on exertion, abdominal pain, vomiting, diarrhea.  Her children were sick with similar symptoms last week.  HPI  Past Medical History:  Diagnosis Date  . Anxiety ~ 2009   "once; only for a short time" (08/15/2012)  . Depression ~ 2009   "once; only for a short time" (08/15/2012)  . Migraines    "I think they're migraines, I get them often" (08/15/2012)  . Pancreatitis 08/15/2012  . Pre-eclampsia 2012   with last child   . Swelling of lower limb    "both legs; when I work" (08/15/2012)  . Varicose veins of both lower extremities with pain    "when I work" (08/15/2012)    Patient Active Problem List   Diagnosis Date Noted  . Status post laparoscopic cholecystectomy 08/19/2012  . Gallstone pancreatitis 08/15/2012    Past Surgical History:  Procedure Laterality Date  . CHOLECYSTECTOMY  08/18/2012   Procedure: LAPAROSCOPIC CHOLECYSTECTOMY WITH INTRAOPERATIVE CHOLANGIOGRAM;  Surgeon: Ralene Ok, MD;  Location: [facility];  Service: General;  Laterality: N/A;  . NO PAST SURGERIES       OB History    Gravida  2   Para  2   Term  2   Preterm      AB      Living  2     SAB      TAB      Ectopic      Multiple      Live Births  1            Home Medications    Prior to Admission medications   Medication Sig Start Date End Date Taking? Authorizing Provider  ibuprofen (ADVIL,MOTRIN) 600 MG tablet Take 1 tablet (600 mg total) by mouth every 6 (six) hours. 08/31/13   Street, Sharon Mt, MD  ondansetron (ZOFRAN ODT) 4 MG disintegrating tablet Take 1 tablet (4 mg total) by mouth every 8 (eight) hours as needed for nausea or vomiting. 11/14/18   Kinnie Feil, PA-C  Prenatal Vit-Fe Fumarate-FA (PRENATAL MULTIVITAMIN) TABS tablet Take 1 tablet by mouth daily at 12 noon.    [provider]    Family History No family history on file.  Social History Social History   Tobacco Use  . Smoking status: Former Smoker    Types: Cigarettes  . Smokeless tobacco: Never Used  . Tobacco comment: 08/15/2012 "smoked w/friend sometimes; just one"  Substance Use Topics  . Alcohol use: Yes    Comment: 08/15/2012 " 1 drink/ month"  . Drug use: No     Allergies   Patient has no known allergies.   Review of Systems Review of Systems  Constitutional: Positive for fever.  HENT: Positive for congestion.   Respiratory: Positive for cough, chest tightness and shortness of breath.   Gastrointestinal: Positive for nausea.  Musculoskeletal: Positive for myalgias.  Neurological: Positive for light-headedness and headaches.  All other systems reviewed and are negative.    Physical Exam Updated Vital Signs BP 118/75   Pulse 62   Temp 98.7 F (37.1 C) (Oral)   Resp 17   Ht 5\' 1"  (1.549 m)   Wt 68 kg   SpO2 99%   BMI 28.34 kg/m   Physical Exam Vitals signs and nursing note reviewed.  Constitutional:      Appearance: She is well-developed.     Comments: Non toxic  HENT:     Head: Normocephalic and atraumatic.     Nose: Nose normal.  Eyes:     Conjunctiva/sclera:  Conjunctivae normal.     Pupils: Pupils are equal, round, and reactive to light.  Neck:     Musculoskeletal: Normal range of motion.  Cardiovascular:     Rate and Rhythm: Normal rate and regular rhythm.  Pulmonary:     Effort: Pulmonary effort is normal.     Breath sounds: Normal breath sounds.     Comments: No decreased BS, crackles, wheezing. Normal WOB.  Abdominal:     General: Bowel sounds are normal.     Palpations: Abdomen is soft.     Tenderness: There is no abdominal tenderness.     Comments: No suprapubic or CVA tenderness   Musculoskeletal: Normal range of motion.  Skin:    General: Skin is warm and dry.     Capillary Refill: Capillary refill takes less than 2 seconds.  Neurological:     Mental Status: She is alert and oriented to person, place, and time.  Psychiatric:        Behavior: Behavior normal.      ED Treatments / Results  Labs (all labs ordered are listed, but only abnormal results are displayed) Labs Reviewed  BASIC METABOLIC PANEL - Abnormal; Notable for the following components:      Result Value   Potassium 3.3 (*)    Glucose, Bld 118 (*)    All other components within normal limits  CBC    EKG None  Radiology Dg Chest 2 View  Result Date: 11/14/2018 CLINICAL DATA:  Cough, shortness of breath for 1 week EXAM: CHEST - 2 VIEW COMPARISON:  None. FINDINGS: The heart size and mediastinal contours are within normal limits. Both lungs are clear. The visualized skeletal structures are unremarkable. IMPRESSION: No active cardiopulmonary disease. Electronically Signed   By: Kathreen Devoid   On: 11/14/2018 02:16    Procedures Procedures (including critical care time)  Medications Ordered in ED Medications  sodium chloride flush (NS) 0.9 % injection 3 mL (has no administration in time range)     Initial Impression / Assessment and Plan / ED Course  I have reviewed the triage vital signs and the nursing notes.  Pertinent labs & imaging results that  were available during my care of the patient were reviewed by me and considered in my medical decision making (see chart for details).    28 y.o. -year-old female with ppmh opresents with URI like symptoms  6 days. Known sick contacts. On my exam patient is nontoxic appearing, speaking in full sentences, w/o increased WOB. No fever, tachypnea, tachycardia, hypoxia. Lungs are CTAB.  Labs and x-ray at triage ordered, reviewed and w/o leukocytosis, electrolyte abnormalities. CXR w/o infection. Doubt bacterial bronchitis or  pneumonia.  Her SOB sounds like it is secondary to forceful frequent coughing, this is not exertional, pleuritic. I doubt ACS or PE, PERC negative.  Given reassuring physical exam, will discharge with symptomatic treatment. Strict ED return precautions given. Patient is aware that a viral URI infection may precede pneumonia or worsening illness. Patient is aware of red flag symptoms to monitor for that would warrant return to the ED for further reevaluation.    Final Clinical Impressions(s) / ED Diagnoses   Final diagnoses:  Influenza-like illness    ED Discharge Orders         Ordered    ondansetron (ZOFRAN ODT) 4 MG disintegrating tablet  Every 8 hours PRN     11/14/18 0638           Kinnie Feil, PA-C 11/14/18 0703    Ripley Fraise, MD 11/14/18 [phone]

## 2018-11-14 NOTE — ED Notes (Signed)
Provider assessment compelted in Spanish, see provider's assessment.

## 2018-11-14 NOTE — Discharge Instructions (Addendum)
You were seen in the ER for headaches, body aches, fever, nausea, light-headedness, cough.  Your symptoms are most likely from a virus that has caused an upper respiratory infection. Possible influenza. A viral illness typically peaks on day 2-3 and improves after one week.     The main treatment approach for a viral upper respiratory infection is to treat the symptoms, support your immune system and prevent spread of illness.    Stay well-hydrated. Rest. You can use over the counter medications to help with symptoms: 600 mg ibuprofen (motrin, advil) or acetaminophen (tylenol) every 6 hours, around the clock to help with associated fevers, sore throat, headaches, generalized body aches and malaise.  Oxymetazoline (afrin) intranasal spray once daily for no more than 3 days to help with congestion, after 3 days you can switch to another over-the-counter nasal steroid spray such as fluticasone (flonase) Allergy medication (loratadine, cetirizine, etc) and phenylephrine (sudafed) help with nasal congestion, runny nose and postnasal drip.   Dextromethorphan (Delsym) to suppress dry cough  Guaifenesin (mucinex) to help wet cough, expectorate built up mucus in chest  Wash your hands often to prevent spread.  Ondansatron (Zofran) for nausea    A viral upper respiratory infection can also worsen and progress into pneumonia.  Monitor your symptoms over the next 48 horus. Return for persistent fevers (greater than 100.4 F), chest pain or shortness of breath with activity, productive cough, inability to tolerate fluids despite nausea medicine.  Usted vino a la sala de Multimedia programmer por dolores de [place], dolores en el cuerpo, [place], nuseas, [place], tos. Sus sntomas son ms probables por un virus que ha causado una infeccin de las vas respiratorias superiores. Posiblemente influenza. Una enfermedad viral generalmente dura una semana.  El tratamiento principal para una infeccin viral es tratar los sntomas,  apoyar el sistema inmunitario y Publishing rights manager propagacin de [place].  Mantente bien hidratada. Descanse. Puede usar estos medicamentos  para ayudar con los sntomas: 600 mg de ibuprofeno (motrina, advil) o acetaminofeno (tylenol) cada 6 horas, durante todo el da para ayudar con la fiebre, dolores de garganta, dolores de cabeza, dolores corporales generalizados y Tree surgeon general. Civil Service fast streamer intranasal de oximetazolina (afrin) una vez al da no ms de 3 das para ayudar con la congestin nasal, despus de 3 das puede cambiar a otro aerosol nasal de esteroides llamado fluticasona (flonase) Los medicamentos para la Buyer, retail (loratadina, cetirizina, etc.) y la fenilefrina (sudafed) ayudan con la congestin nasal, secrecin nasal y goteo posnasal. Dextrometorfano (Delsym) para suprimir la tos seca Guaifenesina (mucinex) para ayudar con la tos con flema, ayuda a expectorar la acumulacin de flema en el pecho Lvese las manos con frecuencia para Scientific laboratory technician. Ondansatron (Zofran) para las nuseas  Una infeccin viral de las vas respiratorias superiores tiene riesgfo de Copy y Special educational needs teacher a neumona. Monitoree sus sntomas durante los prximos 48 horas. Regrese si desarrolla fiebre persistentes (ms de 100.4 F), dolor en el pecho o dificultad al respirar con actividad, tos productiva, incapacidad para tolerar lquidos a pesar de los medicamentos para las nuseas.

## 2019-06-07 ENCOUNTER — Other Ambulatory Visit: Payer: Self-pay | Admitting: Medical

## 2019-06-07 DIAGNOSIS — E041 Nontoxic single thyroid nodule: Secondary | ICD-10-CM

## 2019-06-11 ENCOUNTER — Other Ambulatory Visit: Payer: Self-pay | Admitting: Medical

## 2019-06-11 ENCOUNTER — Ambulatory Visit
Admission: RE | Admit: 2019-06-11 | Discharge: 2019-06-11 | Disposition: A | Discharge status: Home / Self Care | Payer: Self-pay | Source: Ambulatory Visit | Attending: Medical | Admitting: Medical

## 2019-06-11 DIAGNOSIS — E041 Nontoxic single thyroid nodule: Secondary | ICD-10-CM

## 2019-07-18 ENCOUNTER — Ambulatory Visit
Admission: RE | Admit: 2019-07-18 | Discharge: 2019-07-18 | Disposition: A | Discharge status: Home / Self Care | Payer: Self-pay | Source: Ambulatory Visit | Attending: Medical | Admitting: Medical

## 2019-07-18 ENCOUNTER — Other Ambulatory Visit (HOSPITAL_COMMUNITY)
Admission: RE | Admit: 2019-07-18 | Discharge: 2019-07-18 | Disposition: A | Discharge status: Home / Self Care | Payer: No Typology Code available for payment source | Source: Ambulatory Visit | Attending: Interventional Radiology | Admitting: Interventional Radiology

## 2019-07-18 DIAGNOSIS — E041 Nontoxic single thyroid nodule: Secondary | ICD-10-CM

## 2019-07-18 DIAGNOSIS — C73 Malignant neoplasm of thyroid gland: Secondary | ICD-10-CM | POA: Insufficient documentation

## 2019-07-20 LAB — CYTOLOGY - NON PAP

## 2019-08-07 ENCOUNTER — Encounter: Payer: Self-pay | Admitting: Hematology and Oncology

## 2019-08-07 ENCOUNTER — Inpatient Hospital Stay: Payer: Self-pay

## 2019-08-07 ENCOUNTER — Telehealth: Payer: Self-pay | Admitting: Hematology and Oncology

## 2019-08-07 ENCOUNTER — Inpatient Hospital Stay: Payer: Self-pay | Attending: Hematology and Oncology | Admitting: Hematology and Oncology

## 2019-08-07 ENCOUNTER — Other Ambulatory Visit: Payer: Self-pay

## 2019-08-07 VITALS — BP 127/77 | HR 74 | Temp 98.7°F | Resp 17 | Ht 61.0 in | Wt 147.6 lb

## 2019-08-07 DIAGNOSIS — Z87891 Personal history of nicotine dependence: Secondary | ICD-10-CM | POA: Insufficient documentation

## 2019-08-07 DIAGNOSIS — R1310 Dysphagia, unspecified: Secondary | ICD-10-CM | POA: Insufficient documentation

## 2019-08-07 DIAGNOSIS — R0602 Shortness of breath: Secondary | ICD-10-CM | POA: Insufficient documentation

## 2019-08-07 DIAGNOSIS — F418 Other specified anxiety disorders: Secondary | ICD-10-CM | POA: Insufficient documentation

## 2019-08-07 DIAGNOSIS — R0789 Other chest pain: Secondary | ICD-10-CM | POA: Insufficient documentation

## 2019-08-07 DIAGNOSIS — C73 Malignant neoplasm of thyroid gland: Secondary | ICD-10-CM | POA: Insufficient documentation

## 2019-08-07 DIAGNOSIS — Z23 Encounter for immunization: Secondary | ICD-10-CM | POA: Insufficient documentation

## 2019-08-07 LAB — CMP (CANCER CENTER ONLY)
ALT: 12 U/L (ref 0–44)
AST: 14 U/L — ABNORMAL LOW (ref 15–41)
Albumin: 4.6 g/dL (ref 3.5–5.0)
Alkaline Phosphatase: 61 U/L (ref 38–126)
Anion gap: 12 (ref 5–15)
BUN: 8 mg/dL (ref 6–20)
CO2: 23 mmol/L (ref 22–32)
Calcium: 9.4 mg/dL (ref 8.9–10.3)
Chloride: 108 mmol/L (ref 98–111)
Creatinine: 0.72 mg/dL (ref 0.44–1.00)
GFR, Est AFR Am: >60 mL/min (ref >60)
GFR, Estimated: >60 mL/min (ref >60)
Glucose, Bld: 79 mg/dL (ref 70–99)
Potassium: 4 mmol/L (ref 3.5–5.1)
Sodium: 143 mmol/L (ref 135–145)
Total Bilirubin: 0.4 mg/dL (ref 0.3–1.2)
Total Protein: 8.2 g/dL — ABNORMAL HIGH (ref 6.5–8.1)

## 2019-08-07 LAB — CBC WITH DIFFERENTIAL (CANCER CENTER ONLY)
Abs Immature Granulocytes: 0.02 10*3/uL (ref 0.00–0.07)
Basophils Absolute: 0.1 10*3/uL (ref 0.0–0.1)
Basophils Relative: 1 %
Eosinophils Absolute: 0.2 10*3/uL (ref 0.0–0.5)
Eosinophils Relative: 2 %
HCT: 39.8 % (ref 36.0–46.0)
Hemoglobin: 13.3 g/dL (ref 12.0–15.0)
Immature Granulocytes: 0 %
Lymphocytes Relative: 28 %
Lymphs Abs: 2.5 10*3/uL (ref 0.7–4.0)
MCH: 30.6 pg (ref 26.0–34.0)
MCHC: 33.4 g/dL (ref 30.0–36.0)
MCV: 91.7 fL (ref 80.0–100.0)
Monocytes Absolute: 0.7 10*3/uL (ref 0.1–1.0)
Monocytes Relative: 7 %
Neutro Abs: 5.4 10*3/uL (ref 1.7–7.7)
Neutrophils Relative %: 62 %
Platelet Count: 290 10*3/uL (ref 150–400)
RBC: 4.34 MIL/uL (ref 3.87–5.11)
RDW: 11.8 % (ref 11.5–15.5)
WBC Count: 8.9 10*3/uL (ref 4.0–10.5)
nRBC: 0 % (ref 0.0–0.2)

## 2019-08-07 MED ORDER — INFLUENZA VAC SPLIT QUAD 0.5 ML IM SUSY
0.5000 mL | PREFILLED_SYRINGE | Freq: Once | INTRAMUSCULAR | Status: AC
  Administered 2019-08-07: 15:00:00 0.5 mL via INTRAMUSCULAR

## 2019-08-07 MED ORDER — INFLUENZA VAC SPLIT QUAD 0.5 ML IM SUSY
PREFILLED_SYRINGE | INTRAMUSCULAR | Status: AC
Start: 2019-08-07 — End: ?
  Filled 2019-08-07: qty 0.5

## 2019-08-07 NOTE — Telephone Encounter (Signed)
On 10/27, a tc was made to Mary Mcintosh via an interpreter, to schedule an appt for the pt to see Dr. Lorenso Courier on 10/28 at 2pm. She's been made aware to arrive 15 minutes early.

## 2019-08-07 NOTE — Progress Notes (Signed)
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 Lorenso Courier  CHIEF COMPLAINTS/PURPOSE OF CONSULTATION:  Newly diagnosed papillary thyroid cancer  HISTORY OF PRESENTING ILLNESS:  Mary Mcintosh 28 y.o. female with no remarkable past medical history who presents for evaluation of newly diagnosed thyroid cancer. Due to language barrier a [place] Ecologist is used. She is also accompanied today by her husband.    She notes the symptoms that led to the diagnosis occurred first in Jan 2020. She felt that she could not breathe, was having choking episodes, and felt like she would faint. She endorses having trouble swallowing and chest pressure as well. She initially presented to the ED on 11/14/2018, however this was thought to be a URI at the time and she was discharged home.   Her symptoms persisted until her PCP ordered a US of the neck on 06/11/19. A thyroid nodule was detected at that time. She was scheduled for a US guided FNA of the nodule, which was performed on 07/18/19 and revealed a 1.3 cm papillary carcinoma of the thyroid. For further evaluation and management she was referred to [facility].  On exam today she notes she still has some trouble with swallowing and feeling short of breath. She reports since the initial episode began she has not had any significant changes in weight. She does have labile hot/cold tolerance. This has not been associated with N/V/D. She does note she "feels nervous all over her body". She and her  husband had no additional questions or concerns.   A 10 point ROS is listed below.   MEDICAL HISTORY:  Past Medical History:  Diagnosis Date   Anxiety ~ 2009   "once; only for a short time" (08/15/2012)   Depression ~ 2009   "once; only for a short time" (08/15/2012)   Migraines    "I think they're migraines, I get them often" (08/15/2012)   Pancreatitis 08/15/2012   Pre-eclampsia 2012   with last child    Swelling of lower limb    "both legs; when I work" (08/15/2012)   Varicose veins of both lower extremities with pain    "when I work" (08/15/2012)    SURGICAL HISTORY: Past Surgical History:  Procedure Laterality Date   CHOLECYSTECTOMY  08/18/2012   Procedure: LAPAROSCOPIC CHOLECYSTECTOMY WITH INTRAOPERATIVE CHOLANGIOGRAM;  Surgeon: Ralene Ok, MD;  Location: [facility];  Service: General;  Laterality: N/A;   NO PAST SURGERIES      SOCIAL HISTORY: Social History   Socioeconomic History   Marital status: Significant Other    Spouse name: Not on file   Number of children: 1   Years of education: 9th   Highest education level: Not on file  Occupational History   Occupation: H&F Conservation officer, historic buildings: [employer]  Social Designer, fashion/clothing strain: Not on file   Food insecurity    Worry: Not on file    Inability: Not on file   Transportation needs    Medical: Not on file    Non-medical: Not on file  Tobacco Use  Smoking status: Former Smoker    Types: Cigarettes   Smokeless tobacco: Never Used   Tobacco comment: 08/15/2012 "smoked w/friend sometimes; just one"  Substance and Sexual Activity   Alcohol use: Yes    Comment: 08/15/2012 " 1 drink/ month"   Drug use: No   Sexual activity: Yes  Lifestyle   Physical activity    Days per week: Not on file    Minutes per session: Not on file   Stress: Not on file  Relationships   Social connections    Talks on phone: Not on file    Gets together: Not on file    Attends religious  service: Not on file    Active member of club or organization: Not on file    Attends meetings of clubs or organizations: Not on file    Relationship status: Not on file   Intimate partner violence    Fear of current or ex partner: Not on file    Emotionally abused: Not on file    Physically abused: Not on file    Forced sexual activity: Not on file  Other Topics Concern   Not on file  Social History Narrative   Lives in [place] with her boyfriend and child    FAMILY HISTORY: No family history on file.  ALLERGIES:  has No Known Allergies.  MEDICATIONS:  Current Outpatient Medications  Medication Sig Dispense Refill   ibuprofen (ADVIL,MOTRIN) 600 MG tablet Take 1 tablet (600 mg total) by mouth every 6 (six) hours. 50 tablet 0   Prenatal Vit-Fe Fumarate-FA (PRENATAL MULTIVITAMIN) TABS tablet Take 1 tablet by mouth daily at 12 noon.     No current facility-administered medications for this visit.     REVIEW OF SYSTEMS:   Constitutional: ( - ) fevers, ( + )  chills , ( - ) night sweats Eyes: ( - ) blurriness of vision, ( - ) double vision, ( - ) watery eyes Ears, nose, mouth, throat, and face: ( - ) mucositis, ( - ) sore throat Respiratory: ( - ) cough, ( + ) dyspnea, ( - ) wheezes Cardiovascular: ( + ) palpitation, ( +) chest discomfort, ( - ) lower extremity swelling Gastrointestinal:  ( - ) nausea, ( - ) heartburn, ( - ) change in bowel habits Skin: ( - ) abnormal skin rashes Lymphatics: ( - ) new lymphadenopathy, ( - ) easy bruising Neurological: ( - ) numbness, ( - ) tingling, ( - ) new weaknesses Behavioral/Psych: ( - ) mood change, ( - ) new changes  All other systems were reviewed with the patient and are negative.  PHYSICAL EXAMINATION: ECOG PERFORMANCE STATUS: 0 - Asymptomatic  Vitals:   08/07/19 1412  BP: 127/77  Pulse: 74  Resp: 17  Temp: 98.7 F (37.1 C)  SpO2: 100%   Filed Weights   08/07/19 1412  Weight: 147 lb 9.6 oz (67 kg)    GENERAL:  well appearing young Hispanic female in NAD  SKIN: skin color, texture, turgor are normal, no rashes or significant lesions EYES: conjunctiva are pink and non-injected, sclera clear NECK: no lymphadenopathy or thyromegaly noted. nontender to palpation.  LYMPH:  no palpable lymphadenopathy in the cervical, axillary or supraclavicular LUNGS: clear to auscultation and percussion with normal breathing effort HEART: regular rate & rhythm and no murmurs and no lower extremity edema ABDOMEN: soft, non-tender, non-distended, normal bowel sounds Musculoskeletal: no cyanosis of digits and no clubbing  PSYCH: alert & oriented x 3, fluent speech  NEURO: no focal motor/sensory deficits  LABORATORY DATA:  I have reviewed the data as listed Lab Results  Component Value Date   WBC 4.9 11/14/2018   HGB 12.3 11/14/2018   HCT 39.2 11/14/2018   MCV 92.0 11/14/2018   PLT 165 11/14/2018   NEUTROABS 19.2 (H) 08/15/2012    PATHOLOGY:  CYTOLOGY - NON PAP  CASE: [id number]  PATIENT: Mary Mcintosh  Non-Gynecological Cytology Report    Clinical History: LMP lateral nodule 1.4 x 1.0 x 0.9 cm  Specimen Submitted: A. THYROID, LMP LATERAL NODULE, FINE NEEDLE  ASPIRATION:    DIAGNOSIS:  - Findings consistent with papillary carcinoma (Bethesda category VI)   SPECIMEN ADEQUACY:  Satisfactory for evaluation   DIAGNOSTIC COMMENTS:  Intradepartmental consultation (Dr. Saralyn Pilar)   GROSS:  Received is/are 30cc's of peach cytolyt solution and 6 slides in 95%  ethyl alcohol. (GW:gw)  Smears: 6  Concentration Method (ThinPrep): 1  Cell Block: Attempted, not obtained  Additional Studies: Afirma collected    Final Diagnosis performed by Gillie Manners, MD.  Electronically  signed 07/20/2019   RADIOGRAPHIC STUDIES:  [place] Fna Bx Thyroid 1st Lesion Afirma  Result Date: 07/18/2019 INDICATION: 28 year old female with a history of left thyroid nodule referred for biopsy EXAM: ULTRASOUND GUIDED  NEEDLE ASPIRATE BIOPSY OF THE THYROID GLAND COMPARISON:  Outside study 06/04/2019 MEDICATIONS: 1% lidocaine COMPLICATIONS: None PROCEDURE: The procedure, risks, benefits, and alternatives were explained to the patient, via Spanish interpretation. Questions regarding the procedure were encouraged and answered. The patient understands and consents to the procedure. Ultrasound survey was performed with images stored and sent to PACs. The left neck was prepped with chlorhexidine in a sterile fashion, and a sterile drape was applied covering the operative field. A sterile gown and sterile gloves were used for the procedure. Local anesthesia was provided with 1% Lidocaine. Ultrasound guidance was used to infiltrate the region with 1% lidocaine for local anesthesia. Three separate 25 gauge fine needle biopsy were then acquired of the left thyroid nodule using ultrasound guidance, with additional 2 samples for Afirma testing. Images were stored. Slide preparation was performed. Final image was stored after biopsy. Patient tolerated the procedure well and remained hemodynamically stable throughout. No complications were encountered and no significant blood loss was encounter IMPRESSION: Status post ultrasound-guided biopsy of left thyroid nodule measuring 1.3 cm. Signed, Dulcy Fanny. Dellia Nims, RPVI [facility] Radiology Electronically Signed   By: Corrie Mckusick D.O.   On: 07/18/2019 17:09    ASSESSMENT & PLAN Mary Mcintosh 28 y.o. female with no remarkable past medical history who presents for evaluation of newly diagnosed thyroid cancer. The tumor was noted to be 1.3 cm on US imaging, however further imaging with CT scan of the neck should be performed to determine if there is any localized spread of the tumor. This cancer is a papillary thyroid cancer, so if isolated to just the 1.3 cm nodule it will not require RAI and will potentially only require lobectomy.    Overall the prognosis of papillary thyroid cancer in a young female is very good. Although locoregional recurrence can occur post operatively, the survival is for this patient's demographic is excellent, with a reported 50 month survival of 100% (J [place] Surg Soc. 2012;83(5):259-266). The AJCC staging system for papillary thyroid for udner the age of 52 only includes Stage I (M0) and Stag II (M1). Imaging will help [place] determine her stage, but either way the prognosis is excellent. We will work with  ENT to determine a comprehensive plan for her cancer care moving forward.    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 Per NCCN recommendations, when the tumor is >1.0 cm it is recommended to have CT/MRI imaging of the neck. Will order the initial staging CT imaging today -- will have the patient discussed at [facility] next week for evaluation by ENT. Defer decision of lobectomy vs. total thyroidectomy to their service -- will order baseline CMP, CBC, and TSH/T4 today  --RTC in 1 months time, presumably in the interim ENT will have patient scheduled for surgery.   Orders Placed This Encounter  Procedures   CT Soft Tissue Neck W Contrast    Standing Status:   Future    Standing Expiration Date:   08/06/2020    Order Specific Question:   ** REASON FOR EXAM (FREE TEXT)    Answer:   1.3 cm papillary thyroid cancer, assess for spread    Order Specific Question:   If indicated for the ordered procedure, I authorize the administration of contrast media per Radiology protocol    Answer:   Yes    Order Specific Question:   Is patient pregnant?    Answer:   No    Order Specific Question:   Preferred imaging location?    Answer:   [facility]    Order Specific Question:   Radiology Contrast Protocol - do NOT remove file path    Answer:   \[email or web address]   CBC with Differential (Cancer Center Only)     Standing Status:   Future    Number of Occurrences:   1    Standing Expiration Date:   08/06/2020   CMP ([place] only)    Standing Status:   Future    Number of Occurrences:   1    Standing Expiration Date:   08/06/2020   T4    Standing Status:   Future    Number of Occurrences:   1    Standing Expiration Date:   08/06/2020   TSH    Standing Status:   Future    Number of Occurrences:   1    Standing Expiration Date:   08/06/2020    All questions were answered. The patient knows to call the clinic with any problems, questions or concerns.  A total of more than 60 minutes were spent face-to-face with the patient during this encounter and over half of that time was spent on counseling and coordination of care as outlined above.   Ledell Peoples, MD Department of Hematology/Oncology [place] at [facility] Phone: [phone] Pager: [phone] Email: [email or web address]   08/07/2019 3:01 PM   Literature Support:  Lexine Baton, [place] MH, et al. Age and prognosis of papillary thyroid carcinoma: retrospective stratification into three groups. J [place] Surg Soc. 2012;83(5):259-266. doi:10.4174/jkss.2012.83.5.259  --Within a follow-up period median of 50 months, there were 148 (5.1%) locoregional recurrences, 6 (0.2%) PTC-related deaths, and 18 (0.6%) PTC-unrelated deaths. Outcomes were more favorable in the young group, with no PTC-related death despite the frequent locoregional recurrence.

## 2019-08-08 ENCOUNTER — Telehealth: Payer: Self-pay | Admitting: Hematology and Oncology

## 2019-08-08 ENCOUNTER — Other Ambulatory Visit: Payer: Self-pay | Admitting: Hematology and Oncology

## 2019-08-08 DIAGNOSIS — C73 Malignant neoplasm of thyroid gland: Secondary | ICD-10-CM

## 2019-08-08 LAB — T4: T4, Total: 8.1 ug/dL (ref 4.5–12.0)

## 2019-08-08 LAB — TSH: TSH: 0.977 u[IU]/mL (ref 0.308–3.960)

## 2019-08-08 NOTE — Telephone Encounter (Signed)
Scheduled per los. Called with interpreter, left msg. Mailed printout  °

## 2019-08-14 ENCOUNTER — Ambulatory Visit (HOSPITAL_COMMUNITY): Payer: Self-pay

## 2019-08-14 ENCOUNTER — Telehealth: Payer: Self-pay | Admitting: *Deleted

## 2019-08-14 NOTE — Telephone Encounter (Signed)
TCT Almyra Free, our [place] translator and asked her to call pt to advise her that pt's CT Scan was cancelled for today due to recommendation of tumor board.  Almyra Free said she would call pt.

## 2019-08-21 ENCOUNTER — Ambulatory Visit (HOSPITAL_COMMUNITY): Payer: Self-pay

## 2019-08-22 NOTE — H&P (Signed)
HPI:   Mary Mcintosh is a 28 y.o. female who presents as a consult Patient.   Referring Provider: Brantley Stage, PA-C  Chief complaint: Thyroid cancer.  HPI: For the past couple of years she has been having difficulty with throat symptoms. She drinks 2 coffees each morning. She has occasional heartburn. She has mucus buildup in her throat and occasional feeling that her throat is closing up. She has no change in her voice. She denies sore throat. 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 She is here for surgical consultation. She does not smoke or drink.  PMH/Meds/All/SocHx/FamHx/ROS:   History reviewed. No pertinent past medical history.  Past Surgical History:  Procedure Laterality Date  . CHOLECYSTECTOMY   No family history of bleeding disorders, wound healing problems or difficulty with anesthesia.   Social History   Socioeconomic History  . Marital status: Single  Spouse name: Not on file  . Number of children: Not on file  . Years of education: Not on file  . Highest education level: Not on file  Occupational History  . Not on file  Social Needs  . Financial resource strain: Not on file  . Food insecurity  Worry: Not on file  Inability: Not on file  . Transportation needs  Medical: Not on file  Non-medical: Not on file  Tobacco Use  . Smoking status: Never Smoker  . Smokeless tobacco: Never Used  Substance and Sexual Activity  . Alcohol use: Not on file  . Drug use: Not on file  . Sexual activity: Not on file  Lifestyle  . Physical activity  Days per week: Not on file  Minutes per session: Not on file  . Stress: Not on file  Relationships  . Social Medical illustrator on phone: Not on file  Gets together: Not on file  Attends religious service: Not on file  Active member of club or organization: Not on file   Attends meetings of clubs or organizations: Not on file  Relationship status: Not on file  Other Topics Concern  . Not on file  Social History Narrative  . Not on file   No current outpatient medications on file.  A complete ROS was performed with pertinent positives/negatives noted in the HPI. The remainder of the ROS are negative.   Physical Exam:   BP 126/72  Pulse 72  Temp 96.9 F (36.1 C)  Ht 1.549 m (5\' 1" )  Wt 66.7 kg (147 lb)  BMI 27.78 kg/m   General: Healthy and alert, in no distress, breathing easily. Normal affect. In a pleasant mood. Head: Normocephalic, atraumatic. No masses, or scars. Eyes: Pupils are equal, and reactive to light. Vision is grossly intact. No spontaneous or gaze nystagmus. Ears: Ear canals are clear. Tympanic membranes are intact, with normal landmarks and the middle ears are clear and healthy. Hearing: Grossly normal. Nose: Nasal cavities are clear with healthy mucosa, no polyps or exudate. Airways are patent. Face: No masses or scars, facial nerve function is symmetric. Oral Cavity: No mucosal abnormalities are noted. Tongue with normal mobility. Dentition appears healthy. Oropharynx: Tonsils are symmetric. There are no mucosal masses identified. Tongue base appears normal and healthy. Larynx/Hypopharynx: indirect exam reveals healthy, mobile vocal cords, without mucosal lesions in the hypopharynx or larynx. Chest: Deferred Neck: No palpable masses, no cervical adenopathy, small nodule palpable in the left mid thyroid gland. Neuro:  Cranial nerves II-XII with normal function. Balance: Normal gate. Other findings: none.  Independent Review of Additional Tests or Records:  none  Procedures:  none  Impression & Plans:  Her throat symptoms are likely reflux related. Recommend she cut back or eliminate all caffeine.  Small solitary nodule with findings consistent with thyroid carcinoma. Recommend total thyroidectomy. We will refer her to  endocrinology postoperatively for consideration of radioactive iodine if necessary. Given the size of this lesion, it is possible that simple regular follow-up will suffice. Risks and benefits of the surgery were discussed in detail including voice problems, swallowing problems, calcium problems postop. All questions were answered. We will schedule at her convenience.

## 2019-08-28 ENCOUNTER — Ambulatory Visit: Admit: 2019-08-28 | Payer: Self-pay | Admitting: Otolaryngology

## 2019-08-28 SURGERY — THYROIDECTOMY
Anesthesia: General

## 2019-09-04 ENCOUNTER — Inpatient Hospital Stay: Payer: Self-pay | Admitting: Hematology and Oncology

## 2019-09-20 ENCOUNTER — Encounter (HOSPITAL_COMMUNITY): Payer: Self-pay | Admitting: *Deleted

## 2019-09-20 ENCOUNTER — Other Ambulatory Visit: Payer: Self-pay

## 2019-09-20 ENCOUNTER — Emergency Department (HOSPITAL_COMMUNITY)
Admission: EM | Admit: 2019-09-20 | Discharge: 2019-09-20 | Disposition: A | Discharge status: Home / Self Care | Payer: Self-pay | Attending: Emergency Medicine | Admitting: Emergency Medicine

## 2019-09-20 DIAGNOSIS — R42 Dizziness and giddiness: Secondary | ICD-10-CM | POA: Insufficient documentation

## 2019-09-20 DIAGNOSIS — Z87891 Personal history of nicotine dependence: Secondary | ICD-10-CM | POA: Insufficient documentation

## 2019-09-20 LAB — CBC WITH DIFFERENTIAL/PLATELET
Abs Immature Granulocytes: 0.02 10*3/uL (ref 0.00–0.07)
Basophils Absolute: 0.1 10*3/uL (ref 0.0–0.1)
Basophils Relative: 1 %
Eosinophils Absolute: 0.1 10*3/uL (ref 0.0–0.5)
Eosinophils Relative: 1 %
HCT: 39.1 % (ref 36.0–46.0)
Hemoglobin: 12.7 g/dL (ref 12.0–15.0)
Immature Granulocytes: 0 %
Lymphocytes Relative: 26 %
Lymphs Abs: 2.7 10*3/uL (ref 0.7–4.0)
MCH: 30.2 pg (ref 26.0–34.0)
MCHC: 32.5 g/dL (ref 30.0–36.0)
MCV: 92.9 fL (ref 80.0–100.0)
Monocytes Absolute: 0.6 10*3/uL (ref 0.1–1.0)
Monocytes Relative: 6 %
Neutro Abs: 7 10*3/uL (ref 1.7–7.7)
Neutrophils Relative %: 66 %
Platelets: 218 10*3/uL (ref 150–400)
RBC: 4.21 MIL/uL (ref 3.87–5.11)
RDW: 11.8 % (ref 11.5–15.5)
WBC: 10.5 10*3/uL (ref 4.0–10.5)
nRBC: 0 % (ref 0.0–0.2)

## 2019-09-20 LAB — BASIC METABOLIC PANEL
Anion gap: 9 (ref 5–15)
BUN: 10 mg/dL (ref 6–20)
CO2: 25 mmol/L (ref 22–32)
Calcium: 9.2 mg/dL (ref 8.9–10.3)
Chloride: 105 mmol/L (ref 98–111)
Creatinine, Ser: 0.47 mg/dL (ref 0.44–1.00)
GFR calc Af Amer: >60 mL/min (ref >60)
GFR calc non Af Amer: >60 mL/min (ref >60)
Glucose, Bld: 102 mg/dL — ABNORMAL HIGH (ref 70–99)
Potassium: 3.3 mmol/L — ABNORMAL LOW (ref 3.5–5.1)
Sodium: 139 mmol/L (ref 135–145)

## 2019-09-20 LAB — I-STAT BETA HCG BLOOD, ED (MC, WL, AP ONLY): I-stat hCG, quantitative: <5 m[IU]/mL (ref <5)

## 2019-09-20 NOTE — Discharge Instructions (Signed)
Please return for any problem.  Follow-up with your regular care provider as instructed.  Screening labs obtained today did not demonstrate any significant abnormality.  It is important that you follow-up both with your regular doctor as instructed and also with Dr. Constance Holster for scheduling of your pending thyroid surgery.

## 2019-09-20 NOTE — ED Provider Notes (Signed)
[facility] DEPT Provider Note   CSN: [id number] Arrival date & time: 09/20/19  1501     History Chief Complaint  Patient presents with  . Dizziness    Mary Mcintosh is a 28 y.o. female.  28 year old female with prior medical history as detailed below presents for evaluation of dizziness.  Patient reports longstanding dizziness.  She reports onset of symptoms was at least a year ago.  Symptoms will come and go with no particular rhyme or reason.  She denies associated headache, vision change, chest pain, shortness of breath, nausea, vomiting, fever, or other specific complaint.  Patient does have a known thyroid nodule which is going to be operated on within the next month or 2 according to the patient.  Translator service used during this evaluation.  The history is provided by the patient and medical records. A language interpreter was used.  Dizziness Quality:  Room spinning Severity:  Mild Onset quality:  Gradual Duration:  12 months Timing:  Intermittent Progression:  Waxing and waning Chronicity:  Chronic Relieved by:  Nothing Worsened by:  Nothing Ineffective treatments:  None tried      Past Medical History:  Diagnosis Date  . Anxiety ~ 2009   "once; only for a short time" (08/15/2012)  . Depression ~ 2009   "once; only for a short time" (08/15/2012)  . Migraines    "I think they're migraines, I get them often" (08/15/2012)  . Pancreatitis 08/15/2012  . Pre-eclampsia 2012   with last child   . Swelling of lower limb    "both legs; when I work" (08/15/2012)  . Varicose veins of both lower extremities with pain    "when I work" (08/15/2012)    Patient Active Problem List   Diagnosis Date Noted  . Status post laparoscopic cholecystectomy 08/19/2012  . Gallstone pancreatitis 08/15/2012    Past Surgical History:  Procedure Laterality Date  . CHOLECYSTECTOMY  08/18/2012   Procedure: LAPAROSCOPIC CHOLECYSTECTOMY WITH  INTRAOPERATIVE CHOLANGIOGRAM;  Surgeon: Ralene Ok, MD;  Location: [facility];  Service: General;  Laterality: N/A;  . NO PAST SURGERIES       OB History    Gravida  2   Para  2   Term  2   Preterm      AB      Living  2     SAB      TAB      Ectopic      Multiple      Live Births  1           Family History  Problem Relation Age of Onset  . Hyperlipidemia Mother   . Alcohol abuse Father     Social History   Tobacco Use  . Smoking status: Former Smoker    Types: Cigarettes  . Smokeless tobacco: Never Used  . Tobacco comment: 08/15/2012 "smoked w/friend sometimes; just one"  Substance Use Topics  . Alcohol use: Yes    Comment: 08/15/2012 " 1 drink/ month"  . Drug use: No    Home Medications Prior to Admission medications   Medication Sig Start Date End Date Taking? Authorizing Provider  ibuprofen (ADVIL,MOTRIN) 600 MG tablet Take 1 tablet (600 mg total) by mouth every 6 (six) hours. 08/31/13   Street, Sharon Mt, MD  Prenatal Vit-Fe Fumarate-FA (PRENATAL MULTIVITAMIN) TABS tablet Take 1 tablet by mouth daily at 12 noon.    [provider]    Allergies    Patient has  no known allergies.  Review of Systems   Review of Systems  Neurological: Positive for dizziness.  All other systems reviewed and are negative.   Physical Exam Updated Vital Signs BP (!) 140/94 (BP Location: Left Arm)   Pulse 84   Temp 98 F (36.7 C) (Oral)   Resp 18   Ht 5\' 1"  (1.549 m)   Wt 68 kg   SpO2 100%   BMI 28.34 kg/m   Physical Exam Vitals and nursing note reviewed.  Constitutional:      General: She is not in acute distress.    Appearance: Normal appearance. She is well-developed.  HENT:     Head: Normocephalic and atraumatic.  Eyes:     Conjunctiva/sclera: Conjunctivae normal.     Pupils: Pupils are equal, round, and reactive to light.  Cardiovascular:     Rate and Rhythm: Normal rate and regular rhythm.     Heart sounds: Normal heart sounds.   Pulmonary:     Effort: Pulmonary effort is normal. No respiratory distress.     Breath sounds: Normal breath sounds.  Abdominal:     General: There is no distension.     Palpations: Abdomen is soft.     Tenderness: There is no abdominal tenderness.  Musculoskeletal:        General: No deformity. Normal range of motion.     Cervical back: Normal range of motion and neck supple.  Skin:    General: Skin is warm and dry.  Neurological:     General: No focal deficit present.     Mental Status: She is alert and oriented to person, place, and time. Mental status is at baseline.     Cranial Nerves: No cranial nerve deficit.     Sensory: No sensory deficit.     Motor: No weakness.     Coordination: Coordination normal.     Gait: Gait normal.     ED Results / Procedures / Treatments   Labs (all labs ordered are listed, but only abnormal results are displayed) Labs Reviewed  BASIC METABOLIC PANEL - Abnormal; Notable for the following components:      Result Value   Potassium 3.3 (*)    Glucose, Bld 102 (*)    All other components within normal limits  CBC WITH DIFFERENTIAL/PLATELET  I-STAT BETA HCG BLOOD, ED ([facility], [facility], [facility] ONLY)    EKG EKG Interpretation  Date/Time:  Friday September 20 2019 19:24:58 EST Ventricular Rate:  75 PR Interval:    QRS Duration: 91 QT Interval:  399 QTC Calculation: 446 R Axis:   44 Text Interpretation: Sinus rhythm Artifact in lead(s) V4 Confirmed by Dene Gentry [phone]) on 09/20/2019 7:39:39 PM   Radiology No results found.  Procedures Procedures (including critical care time)  Medications Ordered in ED Medications - No data to display  ED Course  I have reviewed the triage vital signs and the nursing notes.  Pertinent labs & imaging results that were available during my care of the patient were reviewed by me and considered in my medical decision making (see chart for details).    MDM Rules/Calculators/A&P MDM  Screen  complete  Mary Mcintosh was evaluated in Emergency Department on 09/20/2019 for the symptoms described in the history of present illness. She was evaluated in the context of the global COVID-19 pandemic, which necessitated consideration that the patient might be at risk for infection with the SARS-CoV-2 virus that causes COVID-19. Institutional protocols and algorithms that pertain to the evaluation  of patients at risk for COVID-19 are in a state of rapid change based on information released by regulatory bodies including the CDC and federal and state organizations. These policies and algorithms were followed during the patient's care in the ED.  Patient is presenting for evaluation of dizziness.  This appears to be a chronic condition for this patient.  Screening exam and labs did not reveal any significant acute pathology.  Patient does appear to be appropriate for discharge.  Importance of close follow-up is stressed.  Strict return precautions given and understood.  Final Clinical Impression(s) / ED Diagnoses Final diagnoses:  Dizziness    Rx / DC Orders ED Discharge Orders    None       Valarie Merino, MD 09/20/19 2038

## 2019-09-20 NOTE — ED Triage Notes (Addendum)
Luis # [id number] Pt states she is having dizziness that has been going on for a long time for days, pressure in chest and strange feeling in left arm. Thyroid cancer? Needs surgery. Decided today was a good day to come in. States heart feels like it is beating fast at times. Feeling like this for a year but has got worse last few days. Opens eyes vision blurring, closes eyes and feels as if she is about to faint. Denies arm and chest pain in triage when asked,

## 2019-09-23 ENCOUNTER — Encounter (HOSPITAL_COMMUNITY): Payer: Self-pay | Admitting: Physician Assistant

## 2019-09-30 ENCOUNTER — Other Ambulatory Visit (HOSPITAL_COMMUNITY): Payer: Self-pay

## 2019-09-30 ENCOUNTER — Inpatient Hospital Stay (HOSPITAL_COMMUNITY): Admission: RE | Admit: 2019-09-30 | Payer: Self-pay | Source: Ambulatory Visit

## 2019-10-02 ENCOUNTER — Ambulatory Visit: Admit: 2019-10-02 | Payer: Self-pay | Admitting: Otolaryngology

## 2019-10-02 SURGERY — THYROIDECTOMY
Anesthesia: General

## 2019-10-11 DIAGNOSIS — C73 Malignant neoplasm of thyroid gland: Secondary | ICD-10-CM

## 2019-10-11 HISTORY — DX: Malignant neoplasm of thyroid gland: C73

## 2019-10-29 NOTE — H&P (Signed)
HPI:   Mary Mcintosh is a 29 y.o. female who presents as a consult Patient.   Referring Provider: Brantley Stage, PA-C  Chief complaint: Thyroid cancer.  HPI: For the past couple of years she has been having difficulty with throat symptoms. She drinks 2 coffees each morning. She has occasional heartburn. She has mucus buildup in her throat and occasional feeling that her throat is closing up. She has no change in her voice. She denies sore throat. 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 She is here for surgical consultation. She does not smoke or drink.  PMH/Meds/All/SocHx/FamHx/ROS:   History reviewed. No pertinent past medical history.  Past Surgical History:  Procedure Laterality Date  . CHOLECYSTECTOMY   No family history of bleeding disorders, wound healing problems or difficulty with anesthesia.   Social History   Socioeconomic History  . Marital status: Single  Spouse name: Not on file  . Number of children: Not on file  . Years of education: Not on file  . Highest education level: Not on file  Occupational History  . Not on file  Social Needs  . Financial resource strain: Not on file  . Food insecurity  Worry: Not on file  Inability: Not on file  . Transportation needs  Medical: Not on file  Non-medical: Not on file  Tobacco Use  . Smoking status: Never Smoker  . Smokeless tobacco: Never Used  Substance and Sexual Activity  . Alcohol use: Not on file  . Drug use: Not on file  . Sexual activity: Not on file  Lifestyle  . Physical activity  Days per week: Not on file  Minutes per session: Not on file  . Stress: Not on file  Relationships  . Social Medical illustrator on phone: Not on file  Gets together: Not on file  Attends religious service: Not on file  Active member of club or organization: Not on file   Attends meetings of clubs or organizations: Not on file  Relationship status: Not on file  Other Topics Concern  . Not on file  Social History Narrative  . Not on file   No current outpatient medications on file.  A complete ROS was performed with pertinent positives/negatives noted in the HPI. The remainder of the ROS are negative.   Physical Exam:   BP 126/72  Pulse 72  Temp 96.9 F (36.1 C)  Ht 1.549 m (5\' 1" )  Wt 66.7 kg (147 lb)  BMI 27.78 kg/m   General: Healthy and alert, in no distress, breathing easily. Normal affect. In a pleasant mood. Head: Normocephalic, atraumatic. No masses, or scars. Eyes: Pupils are equal, and reactive to light. Vision is grossly intact. No spontaneous or gaze nystagmus. Ears: Ear canals are clear. Tympanic membranes are intact, with normal landmarks and the middle ears are clear and healthy. Hearing: Grossly normal. Nose: Nasal cavities are clear with healthy mucosa, no polyps or exudate. Airways are patent. Face: No masses or scars, facial nerve function is symmetric. Oral Cavity: No mucosal abnormalities are noted. Tongue with normal mobility. Dentition appears healthy. Oropharynx: Tonsils are symmetric. There are no mucosal masses identified. Tongue base appears normal and healthy. Larynx/Hypopharynx: indirect exam reveals healthy, mobile vocal cords, without mucosal lesions in the hypopharynx or larynx. Chest: Deferred Neck: No palpable masses, no cervical adenopathy, small nodule palpable in the left mid thyroid gland. Neuro:  Cranial nerves II-XII with normal function. Balance: Normal gate. Other findings: none.  Independent Review of Additional Tests or Records:  none  Procedures:  none  Impression & Plans:  Her throat symptoms are likely reflux related. Recommend she cut back or eliminate all caffeine.  Small solitary nodule with findings consistent with thyroid carcinoma. Recommend total thyroidectomy. We will refer her to  endocrinology postoperatively for consideration of radioactive iodine if necessary. Given the size of this lesion, it is possible that simple regular follow-up will suffice. Risks and benefits of the surgery were discussed in detail including voice problems, swallowing problems, calcium problems postop. All questions were answered. We will schedule at her convenience.

## 2019-10-30 NOTE — Pre-Procedure Instructions (Addendum)
Instrucciones Para Antes de la Ciruga   Su ciruga est programada para-(your procedure is scheduled on) Lunes, 11/04/2019, 07:30 AM- 10:00 AM.   [facility] Admitting - (enter) at 05:30 AM.    Por favor llame al [phone] si tiene algn problema en la maana de la ciruga. (please call if you have any problems the morning of surgery.)                  Recuerde: (Remember)   No coma alimentos ni tome lquidos, incluyendo agua, despus de la medianoche del 11/03/2019 (Do not eat food or drink liquids including water after midnight on 11/03/2019.    Tome estas medicinas en la maana de la ciruga con un SORBITO de agua (take these meds the morning of surgery with a SIP of water): NADA.    As of today, STOP taking any Aspirin (unless otherwise instructed by your surgeon), Aleve, Naproxen, Ibuprofen, Motrin, Advil, Goody's, BC's, all herbal medications, fish oil, and all vitamins.    No use joyas, maquillaje de ojos, lpiz labial, crema para el cuerpo o esmalte de uas oscuro. (Do not wear jewelry, eye makeup, lipstick, body lotion, or dark fingernail polish) Do not shave 48 hours prior to surgery.   Do not bring valuables to the hospital. [facility] is not responsible for any belongings or valuables.    If you are a smoker, DO NOT Smoke 24 hours prior to surgery.  If you wear a CPAP at night please bring your mask the morning of surgery.  Please bring cases for contacts, glasses, hearing aids, dentures or bridgework because it cannot be worn into surgery.   Puede cepillarse los dientes en la maana de la [place]. (you may brush your teeth the morning of surgery)  Si va a ser ingresado despues de la ciruga, deje la maleta en el carro hasta que se le haya asignado una habitacin. (If you are to be admitted after surgery, leave suitcase in car until your room has been assigned.)   A los pacientes que se les d de alta el  mismo da no se les permitir manejar a casa.  (Patients discharged on the day of surgery will not be allowed to drive home)   Use ropa suelta y cmoda de regreso a casa. (wear loose comfortable clothes for ride home)     Special instructions:   [facility]- Preparing For Surgery  Before surgery, you can play an important role. Because skin is not sterile, your skin needs to be as free of germs as possible. You can reduce the number of germs on your skin by washing with CHG (chlorahexidine gluconate) Soap before surgery.  CHG is an antiseptic cleaner which kills germs and bonds with the skin to continue killing germs even after washing.    Oral Hygiene is also important to reduce your risk of infection.  Remember - BRUSH YOUR TEETH THE MORNING OF SURGERY WITH YOUR REGULAR TOOTHPASTE  Please do not use if you have an allergy to CHG or antibacterial soaps. If your skin becomes reddened/irritated stop using the CHG.  Do not shave (including legs and underarms) for at least 48 hours prior to first CHG shower. It is OK to shave your face.  Please follow these instructions carefully.   1. Shower the NIGHT BEFORE SURGERY and the MORNING OF SURGERY with CHG Soap.   2. If you chose to wash your hair, wash your hair first as usual with your normal shampoo.  3. After you shampoo, rinse your hair and body thoroughly to remove the shampoo.  4. Use CHG as you would any other liquid soap. You can apply CHG directly to the skin and wash gently with a scrungie or a clean washcloth.   5. Apply the CHG Soap to your body ONLY FROM THE NECK DOWN.  Do not use on open wounds or open sores. Avoid contact with your eyes, ears, mouth and genitals (private parts). Wash Face and genitals (private parts)  with your normal soap.   6. Wash thoroughly, paying special attention to the area where your surgery will be performed.  7. Thoroughly rinse your body with warm water from the neck down.  8. DO NOT shower/wash  with your normal soap after using and rinsing off the CHG Soap.  9. Pat yourself dry with a CLEAN TOWEL.  10. Wear CLEAN PAJAMAS to bed the night before surgery, wear comfortable clothes the morning of surgery.  11. Place CLEAN SHEETS on your bed the night of your first shower and DO NOT SLEEP WITH PETS.    Day of Surgery:  Please shower the morning of surgery with the CHG soap Do not apply any deodorants/lotions. Please wear clean clothes to the hospital/surgery center.   Remember to brush your teeth WITH YOUR REGULAR TOOTHPASTE.   Please read over the following fact sheets that you were given.

## 2019-10-30 NOTE — Pre-Procedure Instructions (Signed)
Your procedure is scheduled on Monday, January 25th, from 07:30 AM to 10:00 AM.             Report to [facility] Main Entrance "A" at 05:30 A.M., and check in at the Admitting office.             Call this number if you have problems the morning of surgery:             [phone]  Call [phone] if you have any questions prior to your surgery date Monday-Friday 8am-4pm.               Remember:             Do not eat or drink after midnight the night before your surgery.                           Take these medicines the morning of surgery with A SIP OF WATER : NONE  As of today, STOP taking any Aspirin (unless otherwise instructed by your surgeon), Aleve, Naproxen, Ibuprofen, Motrin, Advil, Goody's, BC's, all herbal medications, fish oil, and all vitamins.               The Morning of Surgery             Do not wear jewelry, make-up or nail polish.             Do not wear lotions, powders, perfumes, or deodorant.             Do not shave 48 hours prior to surgery.               Do not bring valuables to the hospital.             [facility] is not responsible for any belongings or valuables.  If you are a smoker, DO NOT Smoke 24 hours prior to surgery.  If you wear a CPAP at night please bring your mask the morning of surgery.  Remember that you must have someone to transport you home after your surgery, and remain with you for 24 hours if you are discharged the same day.   Please bring cases for contacts, glasses, hearing aids, dentures or bridgework because it cannot be worn into surgery.    Leave your suitcase in the car.  After surgery it may be brought to your room.  For patients admitted to the hospital, discharge time will be determined by your treatment team.  Patients discharged the day of surgery will not be allowed to drive home.    Special instructions:   [place]- Preparing For Surgery  Before surgery, you can play an important role.  Because skin is not sterile, your skin needs to be as free of germs as possible. You can reduce the number of germs on your skin by washing with CHG (chlorahexidine gluconate) Soap before surgery.  CHG is an antiseptic cleaner which kills germs and bonds with the skin to continue killing germs even after washing.    Oral Hygiene is also important to reduce your risk of infection.  Remember - BRUSH YOUR TEETH THE MORNING OF SURGERY WITH YOUR REGULAR TOOTHPASTE  Please do not use if you have an allergy to CHG or antibacterial soaps. If your skin becomes reddened/irritated stop using the CHG.  Do not shave (including legs and underarms) for at least 48 hours prior to first CHG shower. It is  OK to shave your face.  Please follow these instructions carefully.                                                                                                                               1. Shower the NIGHT BEFORE SURGERY and the MORNING OF SURGERY with CHG Soap.   2. If you chose to wash your hair, wash your hair first as usual with your normal shampoo.  3. After you shampoo, rinse your hair and body thoroughly to remove the shampoo.  4. Use CHG as you would any other liquid soap. You can apply CHG directly to the skin and wash gently with a scrungie or a clean washcloth.   5. Apply the CHG Soap to your body ONLY FROM THE NECK DOWN.  Do not use on open wounds or open sores. Avoid contact with your eyes, ears, mouth and genitals (private parts). Wash Face and genitals (private parts)  with your normal soap.   6. Wash thoroughly, paying special attention to the area where your surgery will be performed.  7. Thoroughly rinse your body with warm water from the neck down.  8. DO NOT shower/wash with your normal soap after using and rinsing off the CHG Soap.  9. Pat yourself dry with a CLEAN TOWEL.  10. Wear CLEAN PAJAMAS to bed the night before surgery, wear comfortable clothes the morning  of surgery.  11. Place CLEAN SHEETS on your bed the night of your first shower and DO NOT SLEEP WITH PETS.    Day of Surgery:  Please shower the morning of surgery with the CHG soap Do not apply any deodorants/lotions. Please wear clean clothes to the hospital/surgery center.   Remember to brush your teeth WITH YOUR REGULAR TOOTHPASTE.   Please read over the following fact sheets that you were given.

## 2019-10-30 NOTE — Progress Notes (Signed)
Mary Mcintosh, [place] Interpreter notified of potential need during PAT visit tomorrow, 10/31/19.

## 2019-10-31 ENCOUNTER — Other Ambulatory Visit: Payer: Self-pay

## 2019-10-31 ENCOUNTER — Encounter (HOSPITAL_COMMUNITY): Payer: Self-pay

## 2019-10-31 ENCOUNTER — Encounter (HOSPITAL_COMMUNITY)
Admission: RE | Admit: 2019-10-31 | Discharge: 2019-10-31 | Disposition: A | Discharge status: Home / Self Care | Payer: Self-pay | Source: Ambulatory Visit | Attending: Otolaryngology | Admitting: Otolaryngology

## 2019-10-31 ENCOUNTER — Encounter (HOSPITAL_COMMUNITY)
Admission: RE | Admit: 2019-10-31 | Discharge: 2019-10-31 | Disposition: A | Discharge status: Home / Self Care | Payer: Self-pay | Source: Ambulatory Visit | Attending: Anesthesiology | Admitting: Anesthesiology

## 2019-10-31 ENCOUNTER — Inpatient Hospital Stay (HOSPITAL_COMMUNITY): Admission: RE | Admit: 2019-10-31 | Payer: Self-pay | Source: Ambulatory Visit

## 2019-10-31 DIAGNOSIS — E079 Disorder of thyroid, unspecified: Secondary | ICD-10-CM | POA: Insufficient documentation

## 2019-10-31 DIAGNOSIS — Z01818 Encounter for other preprocedural examination: Secondary | ICD-10-CM | POA: Insufficient documentation

## 2019-10-31 LAB — BASIC METABOLIC PANEL
Anion gap: 10 (ref 5–15)
BUN: 11 mg/dL (ref 6–20)
CO2: 22 mmol/L (ref 22–32)
Calcium: 9.2 mg/dL (ref 8.9–10.3)
Chloride: 108 mmol/L (ref 98–111)
Creatinine, Ser: 0.62 mg/dL (ref 0.44–1.00)
GFR calc Af Amer: >60 mL/min (ref >60)
GFR calc non Af Amer: >60 mL/min (ref >60)
Glucose, Bld: 91 mg/dL (ref 70–99)
Potassium: 3.8 mmol/L (ref 3.5–5.1)
Sodium: 140 mmol/L (ref 135–145)

## 2019-10-31 LAB — CBC
HCT: 40.7 % (ref 36.0–46.0)
Hemoglobin: 13 g/dL (ref 12.0–15.0)
MCH: 30.3 pg (ref 26.0–34.0)
MCHC: 31.9 g/dL (ref 30.0–36.0)
MCV: 94.9 fL (ref 80.0–100.0)
Platelets: 271 10*3/uL (ref 150–400)
RBC: 4.29 MIL/uL (ref 3.87–5.11)
RDW: 11.9 % (ref 11.5–15.5)
WBC: 8.1 10*3/uL (ref 4.0–10.5)
nRBC: 0 % (ref 0.0–0.2)

## 2019-10-31 NOTE — Progress Notes (Signed)
PCP - pt denies Cardiologist - pt denies    Chest x-ray - 10/31/19 EKG - 09/23/19 Stress Test - pt denies ECHO - pt denies Cardiac Cath - pt denies       COVID TEST- pt covid test 11/02/19 - pt was educated about self quarantine prior to surgery after covid testing - pt verbalized understanding with interpreter   Anesthesia review: no  Patient denies shortness of breath, fever, cough and chest pain at PAT appointment   All instructions explained to the patient, with a verbal understanding of the material. Patient agrees to go over the instructions while at home for a better understanding. Patient also instructed to self quarantine after being tested for COVID-19. The opportunity to ask questions was provided.

## 2019-11-02 ENCOUNTER — Other Ambulatory Visit (HOSPITAL_COMMUNITY)
Admission: RE | Admit: 2019-11-02 | Discharge: 2019-11-02 | Disposition: A | Discharge status: Home / Self Care | Payer: HRSA Program | Source: Ambulatory Visit | Attending: Otolaryngology | Admitting: Otolaryngology

## 2019-11-02 DIAGNOSIS — Z01812 Encounter for preprocedural laboratory examination: Secondary | ICD-10-CM | POA: Diagnosis present

## 2019-11-02 DIAGNOSIS — Z20822 Contact with and (suspected) exposure to covid-19: Secondary | ICD-10-CM | POA: Diagnosis not present

## 2019-11-03 LAB — SARS CORONAVIRUS 2 (TAT 6-24 HRS): SARS Coronavirus 2: NEGATIVE

## 2019-11-03 NOTE — Anesthesia Preprocedure Evaluation (Addendum)
Anesthesia Evaluation  Patient identified by MRN, date of birth, ID band Patient awake    Reviewed: Allergy & Precautions, H&P , NPO status , Patient's Chart, lab work & pertinent test results  Airway Mallampati: II  TM Distance: >3 FB Neck ROM: Full    Dental no notable dental hx. (+) Partial Upper, Dental Advisory Given   Pulmonary neg pulmonary ROS,    Pulmonary exam normal breath sounds clear to auscultation       Cardiovascular Exercise Tolerance: Good hypertension,  Rhythm:Regular Rate:Normal     Neuro/Psych  Headaches, Anxiety Depression    GI/Hepatic negative GI ROS, Neg liver ROS,   Endo/Other  negative endocrine ROS  Renal/GU negative Renal ROS  negative genitourinary   Musculoskeletal   Abdominal   Peds  Hematology negative hematology ROS (+)   Anesthesia Other Findings   Reproductive/Obstetrics negative OB ROS                            Anesthesia Physical Anesthesia Plan  ASA: II  Anesthesia Plan: General   Post-op Pain Management:    Induction: Intravenous  PONV Risk Score and Plan: 4 or greater and Ondansetron, Dexamethasone and Midazolam  Airway Management Planned: Oral ETT  Additional Equipment:   Intra-op Plan:   Post-operative Plan: Extubation in OR  Informed Consent: I have reviewed the patients History and Physical, chart, labs and discussed the procedure including the risks, benefits and alternatives for the proposed anesthesia with the patient or authorized representative who has indicated his/her understanding and acceptance.     Dental advisory given  Plan Discussed with: CRNA  Anesthesia Plan Comments:         Anesthesia Quick Evaluation

## 2019-11-04 ENCOUNTER — Ambulatory Visit (HOSPITAL_COMMUNITY): Payer: Self-pay | Admitting: Certified Registered Nurse Anesthetist

## 2019-11-04 ENCOUNTER — Encounter (HOSPITAL_COMMUNITY)
Admission: RE | Disposition: A | Discharge status: Home / Self Care | Payer: Self-pay | Source: Home / Self Care | Attending: Otolaryngology

## 2019-11-04 ENCOUNTER — Other Ambulatory Visit: Payer: Self-pay

## 2019-11-04 ENCOUNTER — Ambulatory Visit (HOSPITAL_COMMUNITY): Payer: Self-pay | Admitting: Physician Assistant

## 2019-11-04 ENCOUNTER — Observation Stay (HOSPITAL_COMMUNITY)
Admission: RE | Admit: 2019-11-04 | Discharge: 2019-11-05 | Disposition: A | Discharge status: Home / Self Care | Payer: Self-pay | Attending: Otolaryngology | Admitting: Otolaryngology

## 2019-11-04 ENCOUNTER — Encounter (HOSPITAL_COMMUNITY): Payer: Self-pay | Admitting: Otolaryngology

## 2019-11-04 DIAGNOSIS — C73 Malignant neoplasm of thyroid gland: Principal | ICD-10-CM | POA: Insufficient documentation

## 2019-11-04 DIAGNOSIS — Z9089 Acquired absence of other organs: Secondary | ICD-10-CM

## 2019-11-04 DIAGNOSIS — I10 Essential (primary) hypertension: Secondary | ICD-10-CM | POA: Insufficient documentation

## 2019-11-04 DIAGNOSIS — Z9889 Other specified postprocedural states: Secondary | ICD-10-CM

## 2019-11-04 DIAGNOSIS — E890 Postprocedural hypothyroidism: Secondary | ICD-10-CM

## 2019-11-04 HISTORY — PX: THYROIDECTOMY: SHX17

## 2019-11-04 LAB — CALCIUM
Calcium: 8.8 mg/dL — ABNORMAL LOW (ref 8.9–10.3)
Calcium: 8.9 mg/dL (ref 8.9–10.3)

## 2019-11-04 LAB — POCT PREGNANCY, URINE: Preg Test, Ur: NEGATIVE

## 2019-11-04 SURGERY — THYROIDECTOMY
Anesthesia: General | Site: Neck

## 2019-11-04 MED ORDER — FENTANYL CITRATE (PF) 250 MCG/5ML IJ SOLN
INTRAMUSCULAR | Status: AC
  Filled 2019-11-04: qty 5

## 2019-11-04 MED ORDER — ACETAMINOPHEN 500 MG PO TABS
ORAL_TABLET | ORAL | Status: AC
  Filled 2019-11-04: qty 2

## 2019-11-04 MED ORDER — ONDANSETRON HCL 4 MG/2ML IJ SOLN
INTRAMUSCULAR | Status: AC
  Filled 2019-11-04: qty 2

## 2019-11-04 MED ORDER — LACTATED RINGERS IV SOLN: INTRAVENOUS | Status: DC | PRN

## 2019-11-04 MED ORDER — LIDOCAINE-EPINEPHRINE 1 %-1:100000 IJ SOLN
INTRAMUSCULAR | Status: AC
  Filled 2019-11-04: qty 1

## 2019-11-04 MED ORDER — PROPOFOL 10 MG/ML IV BOLUS
INTRAVENOUS | Status: AC
  Filled 2019-11-04: qty 20

## 2019-11-04 MED ORDER — PROPOFOL 10 MG/ML IV BOLUS
INTRAVENOUS | Status: DC | PRN
  Administered 2019-11-04: 110 mg via INTRAVENOUS

## 2019-11-04 MED ORDER — IBUPROFEN 600 MG PO TABS
600.0000 mg | ORAL_TABLET | Freq: Four times a day (QID) | ORAL | Status: DC
  Administered 2019-11-04 – 2019-11-05 (×3): 600 mg via ORAL
  Filled 2019-11-04 (×3): qty 1

## 2019-11-04 MED ORDER — SUGAMMADEX SODIUM 200 MG/2ML IV SOLN
INTRAVENOUS | Status: DC | PRN
  Administered 2019-11-04: 300 mg via INTRAVENOUS

## 2019-11-04 MED ORDER — MIDAZOLAM HCL 2 MG/2ML IJ SOLN
INTRAMUSCULAR | Status: DC | PRN
  Administered 2019-11-04: 2 mg via INTRAVENOUS

## 2019-11-04 MED ORDER — MIDAZOLAM HCL 2 MG/2ML IJ SOLN
INTRAMUSCULAR | Status: AC
  Filled 2019-11-04: qty 2

## 2019-11-04 MED ORDER — LACTATED RINGERS IV SOLN: INTRAVENOUS | Status: DC

## 2019-11-04 MED ORDER — FENTANYL CITRATE (PF) 250 MCG/5ML IJ SOLN
INTRAMUSCULAR | Status: DC | PRN
  Administered 2019-11-04: 100 ug via INTRAVENOUS
  Administered 2019-11-04 (×2): 50 ug via INTRAVENOUS

## 2019-11-04 MED ORDER — STERILE WATER FOR IRRIGATION IR SOLN
Status: DC | PRN
  Administered 2019-11-04: 1000 mL

## 2019-11-04 MED ORDER — HYDROCODONE-ACETAMINOPHEN 5-325 MG PO TABS
1.0000 | ORAL_TABLET | ORAL | Status: DC | PRN
  Administered 2019-11-04: 10:00:00 2 via ORAL
  Filled 2019-11-04: qty 2

## 2019-11-04 MED ORDER — PROMETHAZINE HCL 25 MG PO TABS: 25.0000 mg | ORAL_TABLET | Freq: Four times a day (QID) | ORAL | Status: DC | PRN

## 2019-11-04 MED ORDER — PROMETHAZINE HCL 25 MG RE SUPP
25.0000 mg | Freq: Four times a day (QID) | RECTAL | Status: DC | PRN
  Filled 2019-11-04: qty 1

## 2019-11-04 MED ORDER — DEXTROSE-NACL 5-0.9 % IV SOLN: INTRAVENOUS | Status: DC

## 2019-11-04 MED ORDER — LIDOCAINE 2% (20 MG/ML) 5 ML SYRINGE
INTRAMUSCULAR | Status: DC | PRN
  Administered 2019-11-04: 60 mg via INTRAVENOUS

## 2019-11-04 MED ORDER — ACETAMINOPHEN 500 MG PO TABS
1000.0000 mg | ORAL_TABLET | Freq: Once | ORAL | Status: AC
  Administered 2019-11-04: 07:00:00 1000 mg via ORAL

## 2019-11-04 MED ORDER — LIDOCAINE-EPINEPHRINE 1 %-1:100000 IJ SOLN
INTRAMUSCULAR | Status: DC | PRN
  Administered 2019-11-04: 3 mL

## 2019-11-04 MED ORDER — ROCURONIUM BROMIDE 10 MG/ML (PF) SYRINGE
PREFILLED_SYRINGE | INTRAVENOUS | Status: DC | PRN
  Administered 2019-11-04: 50 mg via INTRAVENOUS
  Administered 2019-11-04: 20 mg via INTRAVENOUS

## 2019-11-04 MED ORDER — 0.9 % SODIUM CHLORIDE (POUR BTL) OPTIME
TOPICAL | Status: DC | PRN
  Administered 2019-11-04: 1000 mL

## 2019-11-04 MED ORDER — DEXAMETHASONE SODIUM PHOSPHATE 10 MG/ML IJ SOLN
INTRAMUSCULAR | Status: AC
  Filled 2019-11-04: qty 1

## 2019-11-04 MED ORDER — DEXAMETHASONE SODIUM PHOSPHATE 10 MG/ML IJ SOLN
INTRAMUSCULAR | Status: DC | PRN
  Administered 2019-11-04: 10 mg via INTRAVENOUS

## 2019-11-04 MED ORDER — ONDANSETRON HCL 4 MG/2ML IJ SOLN
INTRAMUSCULAR | Status: DC | PRN
  Administered 2019-11-04: 4 mg via INTRAVENOUS

## 2019-11-04 MED ORDER — HYDROMORPHONE HCL 1 MG/ML IJ SOLN: 0.2500 mg | INTRAMUSCULAR | Status: DC | PRN

## 2019-11-04 MED ORDER — LIDOCAINE 2% (20 MG/ML) 5 ML SYRINGE
INTRAMUSCULAR | Status: AC
  Filled 2019-11-04: qty 5

## 2019-11-04 SURGICAL SUPPLY — 41 items
BLADE SURG 15 STRL LF DISP TIS (BLADE) ×1 IMPLANT
BLADE SURG 15 STRL SS (BLADE) ×2
CANISTER SUCT 3000ML PPV (MISCELLANEOUS) ×3 IMPLANT
CLEANER TIP ELECTROSURG 2X2 (MISCELLANEOUS) ×3 IMPLANT
CONT SPEC 4OZ CLIKSEAL STRL BL (MISCELLANEOUS) IMPLANT
CORD BIPOLAR FORCEPS 12FT (ELECTRODE) ×3 IMPLANT
COVER SURGICAL LIGHT HANDLE (MISCELLANEOUS) ×3 IMPLANT
COVER WAND RF STERILE (DRAPES) IMPLANT
DERMABOND ADVANCED (GAUZE/BANDAGES/DRESSINGS) ×2
DERMABOND ADVANCED .7 DNX12 (GAUZE/BANDAGES/DRESSINGS) ×1 IMPLANT
DRAIN HEMOVAC 7FR (DRAIN) ×3 IMPLANT
DRAIN SNY 10 ROU (WOUND CARE) IMPLANT
DRAPE HALF SHEET 40X57 (DRAPES) ×3 IMPLANT
ELECT COATED BLADE 2.86 ST (ELECTRODE) ×3 IMPLANT
ELECT REM PT RETURN 9FT ADLT (ELECTROSURGICAL) ×3
ELECTRODE REM PT RTRN 9FT ADLT (ELECTROSURGICAL) ×1 IMPLANT
EVACUATOR SILICONE 100CC (DRAIN) ×3 IMPLANT
FORCEPS BIPOLAR SPETZLER 8 1.0 (NEUROSURGERY SUPPLIES) ×3 IMPLANT
GAUZE 4X4 16PLY RFD (DISPOSABLE) ×3 IMPLANT
GLOVE BIO SURGEON STRL SZ 6.5 (GLOVE) IMPLANT
GLOVE BIO SURGEONS STRL SZ 6.5 (GLOVE)
GLOVE ECLIPSE 7.5 STRL STRAW (GLOVE) ×6 IMPLANT
GLOVE ECLIPSE 8.0 STRL XLNG CF (GLOVE) ×6 IMPLANT
GOWN STRL REUS W/ TWL LRG LVL3 (GOWN DISPOSABLE) ×2 IMPLANT
GOWN STRL REUS W/TWL LRG LVL3 (GOWN DISPOSABLE) ×4
KIT BASIN OR (CUSTOM PROCEDURE TRAY) ×3 IMPLANT
KIT TURNOVER KIT B (KITS) ×3 IMPLANT
NEEDLE PRECISIONGLIDE 27X1.5 (NEEDLE) ×3 IMPLANT
NS IRRIG 1000ML POUR BTL (IV SOLUTION) ×3 IMPLANT
PAD ARMBOARD 7.5X6 YLW CONV (MISCELLANEOUS) ×6 IMPLANT
PENCIL FOOT CONTROL (ELECTRODE) ×3 IMPLANT
SHEARS HARMONIC 9CM CVD (BLADE) ×3 IMPLANT
STAPLER VISISTAT 35W (STAPLE) ×3 IMPLANT
SUT CHROMIC 3 0 PS 2 (SUTURE) ×3 IMPLANT
SUT CHROMIC 4 0 PS 2 18 (SUTURE) IMPLANT
SUT ETHILON 3 0 PS 1 (SUTURE) ×3 IMPLANT
SUT SILK 2 0 SH (SUTURE) ×3 IMPLANT
SUT SILK 3 0 REEL (SUTURE) ×3 IMPLANT
SUT SILK 4 0 REEL (SUTURE) ×3 IMPLANT
TOWEL GREEN STERILE FF (TOWEL DISPOSABLE) ×3 IMPLANT
TRAY ENT MC OR (CUSTOM PROCEDURE TRAY) ×3 IMPLANT

## 2019-11-04 NOTE — Op Note (Signed)
OPERATIVE REPORT  DATE OF SURGERY: 11/04/2019  PATIENT:  Mary Mcintosh,  29 y.o. female  PRE-OPERATIVE DIAGNOSIS:  THYROID CA   POST-OPERATIVE DIAGNOSIS:  THYROID CA  PROCEDURE:  Procedure(s): TOTAL THYROIDECTOMY  SURGEON:  Beckie Salts, MD  ASSISTANTS: Jodi Marble, MD  ANESTHESIA:   General   EBL: 40 ml  DRAINS: 7 [place] round JP  LOCAL MEDICATIONS USED: 1% Xylocaine with epinephrine  SPECIMEN: Total thyroidectomy, suture marks left lobe  COUNTS:  Correct  PROCEDURE DETAILS: The patient was taken to the operating room and placed on the operating table in the supine position. A shoulder roll was placed beneath the shoulder blades and the neck was extended. The neck was prepped and draped in a standard fashion. A low collar transverse incision was outlined with a marking pen and was incised with a #15 scalpel following local anesthetic application. Dissection was continued down through the platysma layer.  Self-retaining retractors were used throughout the case.  The midline fascia was divided.  The thyroid isthmus was identified and the left side was dissected first.  Reflecting the strap muscles to the left the superior pole was identified and brought anteriorly and inferiorly.  Superior pole vasculature was ligated using the harmonic dissector.  The parathyroid was identified and preserved with its blood supply.  The recurrent nerve was identified and preserved.  The middle thyroid vein was ligated in a similar fashion.  The inferior vasculature was treated in a similar fashion.  The gland was brought forward and dissected off of the trachea using the harmonic dissector and the electrocautery.  The left lobe was marked with a suture.  The same procedure was performed on the right.  The recurrent nerve and a superior parathyroid were identified and preserved.  The entire gland was removed en bloc and sent for pathologic evaluation.  There is a firm 1.5 cm mass on the left  side anteriorly.  No other nodules were identified.  The drain was secured in place with a nylon suture. The midline fascia was reapproximated with interrupted chromic suture. The platysma layer was also reapproximated with interrupted chromic suture. A running subcuticular closure was accomplished. Dermabond was used on the skin. The drain was charged. The patient was awakened, extubated and transferred to recovery in stable condition.   PATIENT DISPOSITION:  To PACU, stable

## 2019-11-04 NOTE — Transfer of Care (Signed)
Immediate Anesthesia Transfer of Care Note  Patient: Mary Mcintosh  Procedure(s) Performed: TOTAL THYROIDECTOMY (N/A Neck)  Patient Location: PACU  Anesthesia Type:General  Level of Consciousness: drowsy and patient cooperative  Airway & Oxygen Therapy: Patient Spontanous Breathing and Patient connected to face mask oxygen  Post-op Assessment: Report given to RN and Post -op Vital signs reviewed and stable  Post vital signs: Reviewed and stable  Last Vitals:  Vitals Value Taken Time  BP 137/80 11/04/19 0905  Temp 36.6 C 11/04/19 0900  Pulse 76 11/04/19 0905  Resp 11 11/04/19 0905  SpO2 98 % 11/04/19 0905  Vitals shown include unvalidated device data.  Last Pain: There were no vitals filed for this visit.    Patients Stated Pain Goal: 3 ([id number])  Complications: No apparent anesthesia complications

## 2019-11-04 NOTE — Progress Notes (Signed)
ENT Post Operative Note  Subjective: No nausea, vomiting No difficulty voiding Pain well controlled  Vitals:   11/04/19 1004 11/04/19 1425  BP: 123/68 108/65  Pulse: 72 78  Resp: 17 18  Temp: 98.7 F (37.1 C) 98.6 F (37 C)  SpO2: 99% 98%     OBJECTIVE  Gen: alert, cooperative, appropriate Head/ENT: EOMI, neck supple, mucus membranes moist and pink, conjunctiva clear Incisions c/d/i, JP drain in place with minor sanguinous drainage, neck soft Face moves symmetrically Respiratory: Voice without dysphonia. non-labored breathing, no accessory muscle use, normal HR, good O2 saturations   Calcium 8.8- will follow.    ASSESS/ PLAN  Aspen Jinny Sanders is a 29 y.o. female who is POD 0 from total thyroidectomy.  -pain control -MIVF- will saline lock when tolerating PO intake - Will follow calcium -SCDs -Maintain JP drain -Stays overnight   Helayne Seminole, MD

## 2019-11-04 NOTE — Interval H&P Note (Signed)
History and Physical Interval Note:  11/04/2019 7:22 AM  Mary Mcintosh  has presented today for surgery, with the diagnosis of THYROID NODULE CA.  The various methods of treatment have been discussed with the patient and family. After consideration of risks, benefits and other options for treatment, the patient has consented to  Procedure(s): TOTAL THYROIDECTOMY (N/A) as a surgical intervention.  The patient's history has been reviewed, patient examined, no change in status, stable for surgery.  I have reviewed the patient's chart and labs.  Questions were answered to the patient's satisfaction.     [name]

## 2019-11-04 NOTE — Plan of Care (Signed)
  Problem: Coping: Goal: Level of anxiety will decrease Outcome: Progressing   Problem: Pain Managment: Goal: General experience of comfort will improve Outcome: Progressing   Problem: Safety: Goal: Ability to remain free from injury will improve Outcome: Progressing   Problem: Skin Integrity: Goal: Risk for impaired skin integrity will decrease Outcome: Progressing   

## 2019-11-04 NOTE — Plan of Care (Signed)

## 2019-11-04 NOTE — Anesthesia Postprocedure Evaluation (Signed)
Anesthesia Post Note  Patient: Montina Trotti  Procedure(s) Performed: TOTAL THYROIDECTOMY (N/A Neck)     Patient location during evaluation: PACU Anesthesia Type: General Level of consciousness: awake and alert Pain management: pain level controlled Vital Signs Assessment: post-procedure vital signs reviewed and stable Respiratory status: spontaneous breathing, nonlabored ventilation and respiratory function stable Cardiovascular status: blood pressure returned to baseline and stable Postop Assessment: no apparent nausea or vomiting Anesthetic complications: no    Last Vitals:  Vitals:   11/04/19 0915 11/04/19 0954  BP: (!) 143/74   Pulse:    Resp:    Temp:  (!) 36.3 C  SpO2:      Last Pain:  Vitals:   11/04/19 0954  PainSc: 2                  Amen Staszak,W. EDMOND

## 2019-11-04 NOTE — Anesthesia Procedure Notes (Signed)
Procedure Name: Intubation Date/Time: 11/04/2019 7:39 AM Performed by: Bryson Corona, CRNA Pre-anesthesia Checklist: Patient identified, Emergency Drugs available, Suction available and Patient being monitored Patient Re-evaluated:Patient Re-evaluated prior to induction Oxygen Delivery Method: Circle System Utilized Preoxygenation: Pre-oxygenation with 100% oxygen Induction Type: IV induction Ventilation: Mask ventilation without difficulty Laryngoscope Size: Mac and 3 Grade View: Grade I Tube type: Oral Tube size: 7.0 mm Number of attempts: 1 Airway Equipment and Method: Stylet Placement Confirmation: ETT inserted through vocal cords under direct vision,  positive ETCO2 and breath sounds checked- equal and bilateral Secured at: 21 cm Tube secured with: Tape Dental Injury: Teeth and Oropharynx as per pre-operative assessment

## 2019-11-05 ENCOUNTER — Other Ambulatory Visit: Payer: Self-pay

## 2019-11-05 LAB — CALCIUM
Calcium: 8.4 mg/dL — ABNORMAL LOW (ref 8.9–10.3)
Calcium: 8.2 mg/dL — ABNORMAL LOW (ref 8.9–10.3)

## 2019-11-05 LAB — SURGICAL PATHOLOGY

## 2019-11-05 MED ORDER — PROMETHAZINE HCL 25 MG RE SUPP: 25.0000 mg | Freq: Four times a day (QID) | RECTAL | 1 refills | Status: DC | PRN

## 2019-11-05 MED ORDER — HYDROCODONE-ACETAMINOPHEN 7.5-325 MG PO TABS: 1.0000 | ORAL_TABLET | Freq: Four times a day (QID) | ORAL | 0 refills | Status: DC | PRN

## 2019-11-05 MED ORDER — LEVOTHYROXINE SODIUM 100 MCG PO TABS: 100.0000 ug | ORAL_TABLET | Freq: Every day | ORAL | 6 refills | Status: DC

## 2019-11-05 MED ORDER — LEVOTHYROXINE SODIUM 100 MCG PO TABS
100.0000 ug | ORAL_TABLET | Freq: Every day | ORAL | Status: DC
  Administered 2019-11-05: 10:00:00 100 ug via ORAL
  Filled 2019-11-05: qty 1

## 2019-11-05 MED FILL — LEVOTHYROXINE SODIUM 100 MC: 100 | 30 days supply | Qty: 30 | Fill #0

## 2019-11-05 MED FILL — HYDROCODON-APAP 7.5-325: 7.5-325 | 5 days supply | Qty: 20 | Fill #0

## 2019-11-05 NOTE — Plan of Care (Signed)

## 2019-11-05 NOTE — Discharge Instructions (Signed)
You may shower and use soap and water. Do not use any creams, oils or ointment.  No heavy lifting for 2 weeks.

## 2019-11-05 NOTE — Progress Notes (Signed)
Caree Hetland to be D/C'd  per MD order. Discussed with the patient and all questions fully answered.  VSS, Skin clean, dry and intact without evidence of skin break down, no evidence of skin tears noted.  IV catheter discontinued intact. Site without signs and symptoms of complications. Dressing and pressure applied.  An After Visit Summary was printed and given to the patient. Patient received prescription.  D/c education completed with patient/family including follow up instructions, medication list, d/c activities limitations if indicated, with other d/c instructions as indicated by MD - patient able to verbalize understanding, all questions fully answered.   Patient instructed to return to ED, call 911, or call MD for any changes in condition.   Patient to be escorted via [place], and D/C home via private auto.

## 2019-11-05 NOTE — Discharge Summary (Signed)
Physician Discharge Summary  Patient ID: Mary Mcintosh MRN: [id number] DOB/AGE: 1991-04-11 29 y.o.  Admit date: 11/04/2019 Discharge date: 11/05/2019  Admission Diagnoses:thyroid cancer  Discharge Diagnoses:  Active Problems:   S/P thyroidectomy   Discharged Condition: good  Hospital Course: no complications  Consults: none  Significant Diagnostic Studies: none  Treatments: surgery: total thyroidectomy  Discharge Exam: Blood pressure 104/70, pulse 66, temperature 98.3 F (36.8 C), temperature source Oral, resp. rate 14, height 5\' 1"  (1.549 m), weight 72.3 kg, last menstrual period 10/07/2019, SpO2 100 %, unknown if currently breastfeeding. PHYSICAL EXAM: Awake and alert. Breathing easily. Voice is good but she is very soft spoken. Neck excellent. JP removed.  Disposition: Discharge disposition: 01-Home or Self Care       Discharge Instructions    Diet - low sodium heart healthy   Complete by: As directed    Increase activity slowly   Complete by: As directed      Allergies as of 11/05/2019   No Known Allergies     Medication List    TAKE these medications   HYDROcodone-acetaminophen 7.5-325 MG tablet Commonly known as: Norco Take 1 tablet by mouth every 6 (six) hours as needed for moderate pain.   ibuprofen 600 MG tablet Commonly known as: ADVIL Take 1 tablet (600 mg total) by mouth every 6 (six) hours.   levothyroxine 100 MCG tablet Commonly known as: Synthroid Take 1 tablet (100 mcg total) by mouth daily.   promethazine 25 MG suppository Commonly known as: PHENERGAN Place 1 suppository (25 mg total) rectally every 6 (six) hours as needed for nausea or vomiting.      Follow-up Information    [name], MD. Schedule an appointment as soon as possible for a visit in 1 week.   Specialty: Otolaryngology Contact information: [address] [place] [address] [phone]           Signed: [name] 11/05/2019, 8:58 AM

## 2020-02-27 ENCOUNTER — Encounter: Payer: Self-pay | Admitting: Internal Medicine

## 2020-02-27 ENCOUNTER — Ambulatory Visit (INDEPENDENT_AMBULATORY_CARE_PROVIDER_SITE_OTHER): Payer: Self-pay | Admitting: Internal Medicine

## 2020-02-27 ENCOUNTER — Other Ambulatory Visit: Payer: Self-pay

## 2020-02-27 VITALS — BP 122/84 | HR 78 | Resp 12 | Ht 60.0 in | Wt 152.0 lb

## 2020-02-27 DIAGNOSIS — J302 Other seasonal allergic rhinitis: Secondary | ICD-10-CM

## 2020-02-27 DIAGNOSIS — H539 Unspecified visual disturbance: Secondary | ICD-10-CM

## 2020-02-27 DIAGNOSIS — C73 Malignant neoplasm of thyroid gland: Secondary | ICD-10-CM

## 2020-02-27 DIAGNOSIS — F411 Generalized anxiety disorder: Secondary | ICD-10-CM

## 2020-02-27 DIAGNOSIS — F419 Anxiety disorder, unspecified: Secondary | ICD-10-CM

## 2020-02-27 MED ORDER — MOMETASONE FUROATE 50 MCG/ACT NA SUSP: 2.0000 | Freq: Every day | NASAL | 12 refills | Status: DC

## 2020-02-27 MED ORDER — FEXOFENADINE HCL 180 MG PO TABS: 180.0000 mg | ORAL_TABLET | Freq: Every day | ORAL | Status: DC

## 2020-02-27 NOTE — Progress Notes (Signed)
Subjective:    Patient ID: Mary Mcintosh, female   DOB: 01-Jun-1991, 29 y.o.   MRN: [id number]   HPI   Here to establish Interpreted  1.  Papillary Thyroid Cancer:  Underwent total thyroidectomy 11/04/2019 with Dr. Olga Coaster, which showed a 1.2 cm papillary thyroid tumor.  Pathology showed extension of the tumor into resected margin and perithyroidal connective tissue.  No lymph node appears to have been submitted.   Patient is not aware of any planned follow up.   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 He last saw her 11/12/2019. She was supposed to follow up in 2 weeks, but does not appear that occurred. She is not aware of any planned follow up currently. Do not see Iodine uptake scan.    2.  Anxiety and stress since the surgery.  She is scared and anxious.  She is afraid something else is wrong with her.  Scored high on GAD7.  Would be willing to work with Dwan Bolt, LCSW  At times, poor sleep.  She is working a lot and does not have time to be physically active on her own time.  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  She has one day off per week.  Can work 8-12 hours daily.  3.  Vision change:  Sees spots on a daily basis.  Can happen at any time.  Lasts for seconds and then gone.  No headache occurs subsequently.  Notes muscle "spasms" near her eye frequently.  Symptoms with eyes more prominent with anxiety.  4.  Itching on her arms and back since thyroid surgery.  No rash associated with itching. Has also noted itchy, watery eyes, nose, throat and sneezing.  Has had problems in spring with pollen in past.    Current Meds  Medication Sig  . levothyroxine (SYNTHROID) 100 MCG tablet Take 1 tablet (100 mcg total) by mouth daily.   No Known Allergies   Past Medical History:  Diagnosis Date  . Anxiety ~ 2009   "once; only for a short time" (08/15/2012)  . Anxiety    "I had anxiety and was prescribed medications,  but I stopped taking the medications because it made me feel worse" (10/31/2019)  . Depression ~ 2009   "once; only for a short time" (08/15/2012)  . Migraines    "I think they're migraines, I get them often" (08/15/2012)  . Pancreatitis 08/15/2012  . Pre-eclampsia 2012   with last child   . Swelling of lower limb    "both legs; when I work" (08/15/2012)  . Varicose veins of both lower extremities with pain    "when I work" (08/15/2012)    Past Surgical History:  Procedure Laterality Date  . CHOLECYSTECTOMY  08/18/2012   Procedure: LAPAROSCOPIC CHOLECYSTECTOMY WITH INTRAOPERATIVE CHOLANGIOGRAM;  Surgeon: Ralene Ok, MD;  Location: [facility];  Service: General;  Laterality: N/A;  . THYROIDECTOMY N/A 11/04/2019   Procedure: TOTAL THYROIDECTOMY;  Surgeon: [name], MD;  Location: [facility];  Service: ENT;  Laterality: N/A;    Family History  Problem Relation Age of Onset  . Hyperlipidemia Mother   . GER disease Mother   . Alcohol abuse Father   . Cirrhosis Father     Social History   Socioeconomic History  . Marital status: Soil scientist    Spouse name: Not on file  . Number of children: 2  . Years of education: 9th  .  Highest education level: Not on file  Occupational History  . Occupation: Designer, industrial/product  Tobacco Use  . Smoking status: Never Smoker  . Smokeless tobacco: Never Used  . Tobacco comment: "i've never smoked" 10/31/19  Vaping Use  . Vaping Use: Never used  Substance and Sexual Activity  . Alcohol use: Yes    Comment: 3 beers once weekly  . Drug use: No  . Sexual activity: Yes  Other Topics Concern  . Not on file  Social History Narrative   Lives in [place] with her boyfriend and their two children   Social Determinants of Health   Financial Resource Strain: Low Risk   . Difficulty of Paying Living Expenses: Not very hard  Food Insecurity: No Food Insecurity  . Worried About Charity fundraiser in the Last Year: Never true  . Ran Out  of Food in the Last Year: Never true  Transportation Needs: No Transportation Needs  . Lack of Transportation (Medical): No  . Lack of Transportation (Non-Medical): No  Physical Activity:   . Days of Exercise per Week:   . Minutes of Exercise per Session:   Stress: Stress Concern Present  . Feeling of Stress : Rather much  Social Connections: Socially Isolated  . Frequency of Communication with Friends and Family: Once a week  . Frequency of Social Gatherings with Friends and Family: Once a week  . Attends Religious Services: Never  . Active Member of Clubs or Organizations: No  . Attends Archivist Meetings: Never  . Marital Status: Living with partner  Intimate Partner Violence: Not At Risk  . Fear of Current or Ex-Partner: No  . Emotionally Abused: No  . Physically Abused: No  . Sexually Abused: No   GAD 7 : Generalized Anxiety Score 04/07/2020 02/28/2020  Nervous, Anxious, on Edge 1 2  Control/stop worrying 1 1  Worry too much - different things 0 2  Trouble relaxing 1 2  Restless 1 2  Easily annoyed or irritable 1 2  Afraid - awful might happen 2 3  Total GAD 7 Score 7 14  Anxiety Difficulty - Very difficult       Review of Systems    Objective:   BP 122/84 (BP Location: Left Arm, Patient Position: Sitting, Cuff Size: Normal)   Pulse 78   Resp 12   Ht 5' (1.524 m)   Wt 152 lb (68.9 kg)   LMP 02/22/2020   Breastfeeding No   BMI 29.69 kg/m   Physical Exam  NAD HEENT:  PERRL, EOMI,  TMs pearly gray, throat with mild cobbling.  Nasal mucosa swollen and boggy. Neck:  Supple, No adenopathy, no thyromegaly.  Well healed anterior neck surgical scar Chest:  CTA CV:  RRR with normal S1 and S2, No S3, S4 or murmur.  Radial and DP pulses normal and equal. Abd:  S, NT, No HSM or mass, + BS LE:  No edema.   Assessment & Plan   1.  Papillary Thyroid Carcinoma:  She may be considered an intermediate risk with microscopic extension into perithyroid tissue.   Will discuss with endocrine and get back to patient.  Not clear why she did not follow up.  Sounds like she was not aware of a follow up appt. Thyroglobulin, TSH, Free T4.  Addendum:  Had patient set up to see Dr. Buddy Duty, Endocrinology.  Patient called back to cancel as she cannot make the appt.  Have not been able to get hold of  her to regroup.  2.  GAD:  Already introduced to [employer], LCSW.   Encouraged counseling. Not clear if some of her visual complaints related.    3.  Seasonal allergies:  Fexofenadine 180 mg daily as needed.   Nasonex 2 sprays each nostril daily.    Follow up in 1 month for family planning

## 2020-02-28 ENCOUNTER — Encounter: Payer: Self-pay | Admitting: Clinical

## 2020-02-28 LAB — TSH+FREE T4
Free T4: 1.65 ng/dL (ref 0.82–1.77)
TSH: 1.18 u[IU]/mL (ref 0.450–4.500)

## 2020-02-28 NOTE — Progress Notes (Signed)
Social worker met with new patient who is scheduled with Dr. Amil Amen for medical visit. Social worker completed New Patient Questionnaire which included completion of housing, intimate partner violence, transportation needs, stress, Emergency planning/management officer strain, food insecurity and screeners of PHQ9 (9) and GAD-7(14). Patient shared due to her earlier this year of thyroid surgery, she didn't have a follow up and then continued worry if there is something else wrong with her as she does not feel the same physically and now experiences worries for her health.   Based on presentation Recommended counseling. After appointment with provider patient agreed appointment provided for May 26th 3pm

## 2020-03-04 ENCOUNTER — Other Ambulatory Visit: Payer: Self-pay | Admitting: Clinical

## 2020-03-07 LAB — THYROGLOBULIN LEVEL: Thyroglobulin (TG-RIA): <2 ng/mL

## 2020-03-07 LAB — COMPREHENSIVE METABOLIC PANEL
ALT: 13 IU/L (ref 0–32)
AST: 15 IU/L (ref 0–40)
Albumin/Globulin Ratio: 1.8 (ref 1.2–2.2)
Albumin: 4.9 g/dL — ABNORMAL HIGH (ref 3.8–4.8)
Alkaline Phosphatase: 75 IU/L (ref 48–121)
BUN/Creatinine Ratio: 16 (ref 9–23)
BUN: 10 mg/dL (ref 6–20)
Bilirubin Total: 0.3 mg/dL (ref 0.0–1.2)
CO2: 21 mmol/L (ref 20–29)
Calcium: 9.6 mg/dL (ref 8.7–10.2)
Chloride: 103 mmol/L (ref 96–106)
Creatinine, Ser: 0.64 mg/dL (ref 0.57–1.00)
GFR calc Af Amer: 138 mL/min/{1.73_m2} (ref >59)
GFR calc non Af Amer: 119 mL/min/{1.73_m2} (ref >59)
Globulin, Total: 2.8 g/dL (ref 1.5–4.5)
Glucose: 81 mg/dL (ref 65–99)
Potassium: 4.2 mmol/L (ref 3.5–5.2)
Sodium: 142 mmol/L (ref 134–144)
Total Protein: 7.7 g/dL (ref 6.0–8.5)

## 2020-03-07 LAB — CBC WITH DIFFERENTIAL/PLATELET
Basophils Absolute: 0.1 10*3/uL (ref 0.0–0.2)
Basos: 1 %
EOS (ABSOLUTE): 0.2 10*3/uL (ref 0.0–0.4)
Eos: 3 %
Hematocrit: 40 % (ref 34.0–46.6)
Hemoglobin: 13.1 g/dL (ref 11.1–15.9)
Immature Grans (Abs): 0 10*3/uL (ref 0.0–0.1)
Immature Granulocytes: 0 %
Lymphocytes Absolute: 2.7 10*3/uL (ref 0.7–3.1)
Lymphs: 34 %
MCH: 30 pg (ref 26.6–33.0)
MCHC: 32.8 g/dL (ref 31.5–35.7)
MCV: 92 fL (ref 79–97)
Monocytes Absolute: 0.5 10*3/uL (ref 0.1–0.9)
Monocytes: 7 %
Neutrophils Absolute: 4.3 10*3/uL (ref 1.4–7.0)
Neutrophils: 55 %
Platelets: 286 10*3/uL (ref 150–450)
RBC: 4.36 x10E6/uL (ref 3.77–5.28)
RDW: 11.5 % — ABNORMAL LOW (ref 11.7–15.4)
WBC: 7.9 10*3/uL (ref 3.4–10.8)

## 2020-03-16 ENCOUNTER — Other Ambulatory Visit: Payer: Self-pay

## 2020-03-17 ENCOUNTER — Other Ambulatory Visit: Payer: Self-pay

## 2020-03-17 DIAGNOSIS — C73 Malignant neoplasm of thyroid gland: Secondary | ICD-10-CM

## 2020-03-17 DIAGNOSIS — E890 Postprocedural hypothyroidism: Secondary | ICD-10-CM

## 2020-03-18 LAB — THYROGLOBULIN ANTIBODY: Thyroglobulin Antibody: <1 IU/mL (ref 0.0–0.9)

## 2020-04-01 ENCOUNTER — Ambulatory Visit: Payer: Self-pay | Admitting: Clinical

## 2020-04-01 DIAGNOSIS — F411 Generalized anxiety disorder: Secondary | ICD-10-CM

## 2020-04-03 ENCOUNTER — Encounter: Payer: Self-pay | Admitting: Internal Medicine

## 2020-04-03 ENCOUNTER — Ambulatory Visit: Payer: Self-pay | Admitting: Internal Medicine

## 2020-04-03 ENCOUNTER — Other Ambulatory Visit: Payer: Self-pay

## 2020-04-03 VITALS — BP 110/82 | HR 68 | Resp 12 | Ht 60.0 in | Wt 152.0 lb

## 2020-04-03 DIAGNOSIS — J302 Other seasonal allergic rhinitis: Secondary | ICD-10-CM

## 2020-04-03 DIAGNOSIS — Z3009 Encounter for other general counseling and advice on contraception: Secondary | ICD-10-CM

## 2020-04-03 DIAGNOSIS — K0889 Other specified disorders of teeth and supporting structures: Secondary | ICD-10-CM

## 2020-04-03 DIAGNOSIS — F419 Anxiety disorder, unspecified: Secondary | ICD-10-CM

## 2020-04-03 DIAGNOSIS — C73 Malignant neoplasm of thyroid gland: Secondary | ICD-10-CM

## 2020-04-03 MED ORDER — LEVOTHYROXINE SODIUM 100 MCG PO TABS: 100.0000 ug | ORAL_TABLET | Freq: Every day | ORAL | 11 refills | Status: DC

## 2020-04-03 MED ORDER — FEXOFENADINE HCL 180 MG PO TABS: 180.0000 mg | ORAL_TABLET | Freq: Every day | ORAL | 11 refills | Status: DC

## 2020-04-03 NOTE — Progress Notes (Signed)
° ° °  Subjective:    Patient ID: Mary Mcintosh, female   DOB: Apr 30, 1991, 29 y.o.   MRN: [id number]   HPI   1.  History of papillary thyroid cancer:  Free T4, TSH, Thyroglobulin, Thyroglobulin antibody all normal recently.  She still has not followed up with ENT.  She did not take the Endocrinology appt with Dr. Buddy Duty, [facility], as she could not get off that day.  Was on 5.20.2021.    2.  Anxiety:  Working with Dwan Bolt, LCSW  3.  Family Planning:  Would like to prevent pregnancy for a time after her recent surgery.  Previously, had Nexplanon.  She had for 3 years.  Sounds like removed in 2019.  Did not have a period for 1 year, then she had blood flow almost continuously.   Many years ago, did try Depo Provera and caused her to gain a lot of weight. Not using any birth control, including condoms, since.  She does not feel she would do well with a daily oral medication.  4.  Allergies:  Did not obtain Nasonex or Allegra.  Lost her papers from last visit and did not know what she was to do.  Does not recall coupons from [facility].        Current Meds  Medication Sig   levothyroxine (SYNTHROID) 100 MCG tablet Take 1 tablet (100 mcg total) by mouth daily.   No Known Allergies   Review of Systems    Objective:   BP 110/82 (BP Location: Left Arm, Patient Position: Sitting, Cuff Size: Normal)    Pulse 68    Resp 12    Ht 5' (1.524 m)    Wt 152 lb (68.9 kg)    LMP 03/19/2020    BMI 29.69 kg/m   Physical Exam  HEENT:  PERRL, EOMI Neck:  Supple, No adenopathy, no thyroid area mass. Lungs:  CTA CV:  RRR without murmur or rub.  Radial pulses normal and equal.   Assessment & Plan  1.  History of papillary thyroid cancer:  Referral to Dr. Buddy Duty, endocrine for long time followup. To call if Rx for levothyroxine is different than the one she has been taking.  2.  Dental Pain:  Brought this up at end of visit. Dental referral.  3.  Family planning:  Options  discussed.  She seems to be leaning toward IUD.  She will call PHD clinic to get set up with this if she decides to proceed.  Encouraged use of condoms with intercourse until then.  4.  Anxiety:  Continue counseling with Ileana Tol, LCSW.  5.  HM:  Completed COVID vaccination on 6/21.
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
## 2020-04-09 ENCOUNTER — Other Ambulatory Visit: Payer: Self-pay | Admitting: Clinical

## 2020-05-06 ENCOUNTER — Ambulatory Visit: Payer: Self-pay | Admitting: Clinical

## 2020-05-06 DIAGNOSIS — F411 Generalized anxiety disorder: Secondary | ICD-10-CM
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
## 2020-05-10 DIAGNOSIS — F410 Panic disorder [episodic paroxysmal anxiety] without agoraphobia: Secondary | ICD-10-CM | POA: Insufficient documentation

## 2020-05-10 DIAGNOSIS — C73 Malignant neoplasm of thyroid gland: Secondary | ICD-10-CM | POA: Insufficient documentation

## 2020-05-10 DIAGNOSIS — J302 Other seasonal allergic rhinitis: Secondary | ICD-10-CM | POA: Insufficient documentation

## 2020-05-12 ENCOUNTER — Telehealth: Payer: Self-pay

## 2020-05-12 NOTE — Telephone Encounter (Signed)
Spoke with patient. Provided full information in detailed. Verbalized understanding.

## 2020-05-12 NOTE — Telephone Encounter (Signed)
Please notify patient that referral has been sent to Dr. Buddy Duty (Endocrinologist for her thyroid). His office will contact her to schedule appointment. Patient needs to make sure she answers the phone from numbers she does not recognize. Inform patient not sure what her fee will be upfront. I could not get a final answer because Dr. Buddy Duty does discount his uninsured patients but states it could be $250-300 for initial visit. Inform patient if she gets scheduled she can not miss this appointment because they will likely not reschedule her this time.  To Antony Madura to notify patient.

## 2020-05-13 ENCOUNTER — Ambulatory Visit: Payer: Self-pay | Admitting: Clinical

## 2020-05-13 ENCOUNTER — Other Ambulatory Visit: Payer: Self-pay | Admitting: Clinical

## 2020-05-13 DIAGNOSIS — F411 Generalized anxiety disorder: Secondary | ICD-10-CM

## 2020-05-18 ENCOUNTER — Telehealth: Payer: Self-pay | Admitting: Clinical

## 2020-05-18 NOTE — Telephone Encounter (Signed)
LCSW contacted patient as a reminder and confirm of therapy appt. LCSW left message.

## 2020-05-19 ENCOUNTER — Other Ambulatory Visit: Payer: Self-pay | Admitting: Clinical

## 2020-05-19 NOTE — Progress Notes (Signed)
   THERAPY PROGRESS NOTE  Session Time: 3:00-4:00pm Participation Level: Active Behavioral Response: CasualAlertAnxious Type of Therapy: Individual Therapy Treatment Goals addressed: To learn coping skills to decrease anxiety symptoms.    Purpose: LCSW met with client for routine individual therapy to work towards treatment goals to learn coping skills to decrease anxiety symptoms.   Intervention: LCSW provided brief check in to assess any significant events and how they are doing today since last session. LCSW utilized intervention of CBT by providing psychoeducation of this approach. LCSW also provided psychoeducation of mind and body. LCSW provided reflective listening as patient processed her thinking and feeling patterns when she experiences anxiety and panic attacks. LCSW noticed patient affect during session as we discussed anxiety, LCSW explored patients feelings and helped reframe approach to her anxiety. LCSW assessed for SI/HI/command psychosis.  Effectiveness: Patient is alert x4 affect. Patient identified she is ok however notes feeling tired. Patient reports it felt like a panic attack 1x "it's like I want to cry or escape." Patient reports cannot recall if any thing triggered it. Patient processed the physical symptoms of chest, and difficulty breathing. She reports "I try not to think about it and continue what I was doing.   Upon processing her anxiety, patient began to identify her body and mind feeling tired, reports working a lot of hours with only day off is on Saturday. She does recognize emotional support from her husband but feels like time and thought of "need" to finish/clean/cook/ and play with her kids is becoming overwhelming.  As discussing techniques LCSW noticed patient only nodding to skills not really responding, LCSW asked patient about her thought process related to anxiety if she has accepted this, patient began to cry. LCSW validated patients feeling that she  is expressing her feelings in this way and that is okay. It appeared as if patient had been holding on to her feelings. Patient shared she is confused and the feelings of anxiety/panic are uncomfortable. Patient reports "I try to do what you say and others but it just continues" Upon discussing further discussed her interest of music and reports she will attempt to utilize music into her coping skills and will begin to explore the ideas of working 1 less day as she became aware of feeling tired both mentally and physically.    Intervention was effective as patient was able to express her feelings. Progress towards goal is Ongoing. Patient denied active suicidal/homicidal/active psychosis.  Plan Patient offered next appointment for: 05/18/2020 9am.   Diagnosis: Generalized Anxiety Disorder    Ileana P Tol, LCSW 05/19/2020   

## 2020-05-20 ENCOUNTER — Ambulatory Visit: Payer: Self-pay | Admitting: Clinical

## 2020-05-20 DIAGNOSIS — F411 Generalized anxiety disorder: Secondary | ICD-10-CM

## 2020-05-20 NOTE — Progress Notes (Signed)
   THERAPY PROGRESS NOTE  Session Time: 3-4pm Participation Level: Active Behavioral Response: CasualAlertHopeful Type of Therapy: Individual Therapy Treatment Goals addressed: Anxiety   Purpose: LCSW met with client for routine individual therapy to work towards treatment goals: to decrease anxiety symptoms.   Intervention: LCSW provided brief check in to assess for any significant events and assessed how they are doing today since previous session. LCSW asked patient to rate herself 0-5 (0-none at all, 5 high) for any anxiety symptoms for the week. LCSW assess for substances (coffee/alcohol)  LCSW utilized intervention of CBT for this session by facilitating discussion of self care by completing a self care evaluation where patient rated herself on the areas (social, physical, emotional, spiritual and professional) rated between 1 -not good, 2 on a regular, 3 frequently and a star would like work on this. LCSW followed by having patient identify activities she can do for take care of self (emotional/psychological, and spiritual) and identifying who her support is and what she hopes for herself. LCSW facilitated discussion of self care from a holistic approach of the importance of taking care of all areas to one's well being.  Due to LCSW leaving out of town, LCSW provided reminder of crisis resources in the community. LCSW assessed for SI/HI/command psychosis.  Effectiveness: Patient is alert x4 affect. Patient identified she is doing well, working, house chores and being with her kids. For self not much activities have been done. Patient does though she tried to get some rest. Patient rates her anxiety 2/3- "a little bit of both" when asked why/how this is, patient shared she had a lot of thought since last session "my attitude, I feel stronger and I'm being positive"   Patient reports decrease her coffee by the quantity 1-2x a day and changed the coffee. Patient shared the history of coffee is  due to being raised with it so it will be difficult to decrease the use. Patient however did note changes to her alcohol use, patient reports she did drink on Sunday but noticed not feeling the best, patient reports she put thought into it that it was not a healthy behavior especially for her anxiety and her thyroid w/medications. Patient reports she and her husband noted when they drink they tend to disagree more often and that is not normal, patient shared both have decided to make changes to their drinking, and if that means to distant away from family then it will be a change for their family. Patient reports she hopes to be able to drink alcohol socially for celebrations but not to cope with uncomfortable feelings. Patient shared awareness her drinking was beginning to use to cope with these uncomfortable feelings.   Patient was active in the conversation of self care, patient identified awareness of areas not taking care is her emotional, physical and work areas. Patient shared the barriers she faces at work to be able to go on break or even eat healthy. Patient identified starting to take care of emotional by talking with her husband, being with her kids and attending therapy. Patient shared understanding of taking care of these areas for her wellbeing.    . Intervention was effective as patient was able to reflect and discussed the changes needed for well being.. Progress towards goal is Ongoing. Patient denied active suicidal/homicidal/active psychosis.  Plan Patient offered next appointment for: 08/25 3pm  Diagnosis: Generalized Anxiety Disorder    Lujean Rave, LCSW 05/20/2020

## 2020-06-03 ENCOUNTER — Ambulatory Visit: Payer: Self-pay | Admitting: Clinical

## 2020-06-03 DIAGNOSIS — F411 Generalized anxiety disorder: Secondary | ICD-10-CM

## 2020-06-05 NOTE — Progress Notes (Signed)
   THERAPY PROGRESS NOTE  Session Time: 3:00-4:00pm  Participation Level: Active Behavioral Response: CasualAlertEuthymic Type of Therapy: Individual Therapy Treatment Goals addressed: Anxiety and Coping with symptoms.    Purpose: LCSW met with client for routine individual therapy to work towards treatment goals: learning coping skills to decrease anxiety.   Intervention: LCSW met with client for routine individual therapy to work towards treatment goals: learning coping skills to decrease anxiety. LCSW provided patient a brief check in to assess for any new symptoms or significant events. LCSW utilized intervention of CBT to decrease anxiety symptoms by providing psychoeducation "Flight or fight" from [employer] to discuss the body response symptoms to anxiety. LCSW provided patient coping skills to challenge "flight or fight" by utilizing relaxation techniques. LCSW facilitated exercises from handout of imagery, and deep breathing. Patient shared in this session her place of image and was able to describe with the technique and shared how it helps relax. LCSW assessed for SI/HI/command psychosis.  Effectiveness: Patient is alert x4 affect. Patient identified she is tired due to work. Patient shared in check in changes she has made from being active by going to the gym, eating better, less coffee and drinking water. Patient shared update of having an appointment with the specialist in December letting her have time to save money for her visit. Patient also shared self care by going camping on the weekend with her family. Patient reports she had some anxious thoughts but was able to challenge them.  Patient was active in the psychoeducation of flight and fight, patient shared her response when she has anxiety is wanting to escape. Patient was able to identify symptoms when this presents.   Patient shared she already does deep breathing that helps respond to this. Patient shared received  feedback of ideas how to practice the deep breathing at work. Patient identified with the image technique, identified her place that brings her relaxation is a [place] where she is by herself on the beach with a view of a mountain in the back, patient was able to describe this place by identifying the sight, sound, taste and smell. Patient shared she has started to use this skill before bed when she notices having ruminating thoughts and founds herself feeling relaxed and able to sleep. Patient identified in session improving on symptoms as she is accepting her anxiety and finding skills that are helpful and practicing them.    Intervention was effective as patient was able to identify how the use of relaxation skills can help with her anxiety when it is in flight or fight. Progress towards goal is Ongoing. Patient denied active suicidal/homicidal/active psychosis.  Plan Patient offered next appointment for: 09/07 9am.   Diagnosis: Generalized Anxiety Disorder    Lujean Rave, LCSW 06/05/2020

## 2020-06-12 ENCOUNTER — Telehealth: Payer: Self-pay | Admitting: Clinical

## 2020-06-12 NOTE — Telephone Encounter (Signed)
LCSW contacted patient for reminder call for session for Tuesday. Left VM

## 2020-06-16 ENCOUNTER — Telehealth: Payer: Self-pay | Admitting: Clinical

## 2020-06-16 ENCOUNTER — Other Ambulatory Visit: Payer: Self-pay | Admitting: Clinical

## 2020-06-16 NOTE — Telephone Encounter (Signed)
LCSW contacted to see if able to reschedule due to booking on date of vaccine clinic and asked if okay to reschedule for next Tuesday. Patient ok.  LCSW apologized for this.Marland Kitchen

## 2020-06-22 ENCOUNTER — Telehealth: Payer: Self-pay | Admitting: Clinical

## 2020-06-22 NOTE — Telephone Encounter (Signed)
LCSW contacted to remind of appt left VM,

## 2020-06-23 ENCOUNTER — Other Ambulatory Visit: Payer: Self-pay | Admitting: Clinical

## 2020-06-25 ENCOUNTER — Ambulatory Visit: Payer: Self-pay | Admitting: Clinical

## 2020-06-25 ENCOUNTER — Other Ambulatory Visit: Payer: Self-pay

## 2020-06-25 DIAGNOSIS — F411 Generalized anxiety disorder: Secondary | ICD-10-CM

## 2020-06-26 ENCOUNTER — Telehealth: Payer: Self-pay | Admitting: Internal Medicine

## 2020-06-26 NOTE — Telephone Encounter (Signed)
Needs to come in for OV with me and TSH, free T4 , free T3.  For palpitations

## 2020-06-30 ENCOUNTER — Other Ambulatory Visit: Payer: Self-pay | Admitting: Clinical

## 2020-07-01 ENCOUNTER — Other Ambulatory Visit: Payer: Self-pay

## 2020-07-01 DIAGNOSIS — R002 Palpitations: Secondary | ICD-10-CM

## 2020-07-02 LAB — T3, FREE: T3, Free: 2.6 pg/mL (ref 2.0–4.4)

## 2020-07-02 LAB — T4, FREE: Free T4: 1.62 ng/dL (ref 0.82–1.77)

## 2020-07-02 LAB — TSH: TSH: 0.633 u[IU]/mL (ref 0.450–4.500)

## 2020-07-03 NOTE — Telephone Encounter (Signed)
Patient came to get labs done on 07/01/2020; scheduled for ov on 07/13/2020.

## 2020-07-03 NOTE — Progress Notes (Signed)
THERAPY PROGRESS NOTE  Session Time: 10:00-11:00 Participation Level: Active Behavioral Response: CasualAlertAnxious Type of Therapy: Individual Therapy Treatment Goals addressed: Anxiety and Coping   Purpose: LCSW met with client for routine individual therapy to work towards treatment goals: learning coping skills to decrease anxiety symptoms.   Intervention: LCSW met with patient for routine individual. LCSW provided a brief check in for patient to assess how she is doing today and if any significant events occurred since last session. LCSW provided reflective listening as patient processed how she was feeling. Due to patient's check in LCSW validated patient's feeling and facilitated a visualization exercise to assist patient decrease her anxiety presented. LCSW provided relaxation skill of body scan and deep breathing together by scanning her body how she may be feeling and to implement her deep breathing. LCSW will provide this handout for resource next session. LCSW assessed for SI/HI.  Effectiveness: Patient is alert x3 affect. Patient appeared anxious in her presentation. Patient reports during check in feeling stress along with work amount. Patient reports feel tired both physically and mentally. Patient reports feeling heart racing and continued tingleness. Patient reports she went to the beach but noticed she was unable to relax. She reports she is doing everything she can such as going on vacation, doing exercises but continues to feel increase of anxiety. Patient cried as she processed this. Patient participated in the visualization exercise, identified her beach and mountain in the view. Intervention was effective as patient shared this exercise helped her relax a difference when she first came in. patient agreed to practice combining skills such as practiced today. Patient denied active SI/HI.     Progress towards goal is Ongoing. Patient denied active suicidal/homicidal/active  psychosis.  Plan Patient offered next appointment for: 09/30  Diagnosis: Generalized Anxiety Disorder w/panic attacks    Lujean Rave, LCSW 07/03/2020

## 2020-07-09 ENCOUNTER — Ambulatory Visit: Payer: Self-pay | Admitting: Clinical

## 2020-07-09 DIAGNOSIS — F411 Generalized anxiety disorder: Secondary | ICD-10-CM

## 2020-07-09 DIAGNOSIS — F410 Panic disorder [episodic paroxysmal anxiety] without agoraphobia: Secondary | ICD-10-CM

## 2020-07-13 ENCOUNTER — Ambulatory Visit: Payer: Self-pay | Admitting: Internal Medicine

## 2020-07-13 ENCOUNTER — Encounter: Payer: Self-pay | Admitting: Internal Medicine

## 2020-07-13 VITALS — BP 110/64 | HR 72 | Resp 13 | Ht 60.0 in | Wt 152.0 lb

## 2020-07-13 DIAGNOSIS — F410 Panic disorder [episodic paroxysmal anxiety] without agoraphobia: Secondary | ICD-10-CM

## 2020-07-13 DIAGNOSIS — F411 Generalized anxiety disorder: Secondary | ICD-10-CM

## 2020-07-13 NOTE — Progress Notes (Signed)
    Subjective:    Patient ID: Mary Mcintosh, female   DOB: 10-27-1990, 29 y.o.   MRN: [id number]   HPI   1.  Family planning:  She has been seen at [facility] for some sort of vaginal infection.  She was placed on waiting list for IUD placement.   She and her female domestic partner are not consistent with condom use.   Discussed importance of condom use regularly until can have IUD placed.  2.  Palpitations:  She does have panic attacks.  Episodes start with bad thoughts, her throat closes off like she cannot breathe and she wants to run away.  She does have palpitations with these episodes as well.  Last time she had all of these symptoms was 3-4 weeks ago.   She is having episodes of palpitations separate from the symptoms in total above.  Can have twice weekly and last about 2 minutes.  She is able to use calming measures as instructed by Dwan Bolt to decrease the symptoms. She states these have been going on for 2 years prior to her thyroid surgery.   She is tired and has poor energy Perhaps some tremulousness. Feels hot more often. Her recent thyroid testing was normal:  TSH, Free T4 and Free T3 Ultimately, she does feel she is anxious when having the palpitations.   Perhaps a bit light headed with the palpitations.  3.  History of thyroid cancer:  Has an appt with Dr. Buddy Duty, endocrinology, in December for follow up.  She was able to reappoint.   No outpatient medications have been marked as taking for the 07/13/20 encounter (Office Visit) with Mack Hook, MD.   No Known Allergies   Review of Systems    Objective:   BP 110/64 (BP Location: Left Arm, Patient Position: Sitting, Cuff Size: Normal)   Pulse 72   Resp 13   Ht 5' (1.524 m)   Wt 152 lb (68.9 kg)   LMP 07/07/2020 (Exact Date)   BMI 29.69 kg/m   Physical Exam  Appears anxious, fidgeting HEENT:  PERRL, EOMI.  No exophthalmos or lid lag Neck:  Supple, No adenopathy, no thyromegaly.  Surgical  scar at low anterior neck. Chest:  CTA CV:  RRR without murmur or rub.  Radial and DP pulses normal and equal Neuro:  No tremulousness.     Assessment & Plan  1.  Palpitations:  Looking through her counseling records as well, she appears to have symptoms of depression and anxiety.  Have felt she also likely has some trauma in her background. After discussing possible medication management with her, with possibility of taking long term or just even a year and then tapering, along with work with Dwan Bolt, LCSW, she would like to think about it. Discussed the comparison with her hormone therapy for her thyroid and would like for her to think of treatment for mental health in the same way. She elected to think about her options and get back if she would like to proceed with medication.

## 2020-07-15 NOTE — Progress Notes (Signed)
   THERAPY PROGRESS NOTE  Session Time: 10:00-11:00 Participation Level: Active Behavioral Response: CasualAlertEuthymic Type of Therapy: Individual Therapy Treatment Goals addressed: Anxiety and Coping   Purpose: LCSW met with client for routine individual therapy to work towards treatment goals: coping skills to decrease anxiety symptoms.  Intervention: LCSW met with client for routine individual therapy to work towards treatment goals: coping skills to decrease anxiety symptoms. LCSW provided patient a brief check in of how she has been an to rate her anxiety for this week (0-5; none at all-very high). LCSW asked any coping skills she has practiced. LCSW provided reflective listening as patient processed. It should be noted session was short as patient had to leave to go to work at 84. LCSW utilized intervention of CBT by utilizing anxiety manual in Spanish to address feelings and thought challenge of panic attacks. LCSW provided patient psychoeducation of catastrophe thoughts that may present with panic attacks, and what are physical symptoms of panic attacks. Together in session LCSW reviewed handout "10 rules to challenge panic attacks." LCSW provided patient homework resource by cutting the examples of thought challenging for panic attacks with note cards to be easy to use reminders when she experiences panic attacks. LCSW finished session by facilitating coping skill of body scan. LCSW discussed ideas with patient of planning her visit with MD to discuss concerns of medical/anxiety. LCSW assessed for SI/HI/command psychosis.  Effectiveness: Patient is alert x4 affect. Patient identified she is feeling physically and mentally tired but not excessive as previous week. Patient reports coping skills she has practiced are going to the gym at least 2-3x for about 1-2hr with her partner and has been able to enjoying this. Patient rates her anxiety at a 2/3; both half internal and external symptoms.  Patient does reports feeling a panic attack wanting to start this past week however was able to use skill of affirmation "I tried my best and felt like I had the strength"  Patient described further her sensations in her chest, tries to relax however has noticed more constant. Patient reports she is prepping for her medical visit by keeping in mind her options although she is not fond of medication management for anxiety.   Interventions were effective as patient was able identify her symptoms and reports practicing her skills. Patient reflected on body scan and felt relax prior to going to work. Progress towards goal is Ongoing. Patient denied active suicidal/homicidal/active psychosis.  Plan Patient offered next appointment for: 07/23/2020 9am  Diagnosis: Generalized Anxiety Disorder w/Panic attacks    Lujean Rave, LCSW 07/15/2020

## 2020-07-23 ENCOUNTER — Encounter: Payer: Self-pay | Admitting: Internal Medicine

## 2020-07-23 ENCOUNTER — Other Ambulatory Visit: Payer: Self-pay | Admitting: Clinical

## 2020-09-10 ENCOUNTER — Other Ambulatory Visit: Payer: Self-pay | Admitting: Internal Medicine

## 2020-09-10 DIAGNOSIS — E892 Postprocedural hypoparathyroidism: Secondary | ICD-10-CM

## 2020-09-10 DIAGNOSIS — C73 Malignant neoplasm of thyroid gland: Secondary | ICD-10-CM

## 2020-09-10 DIAGNOSIS — R1310 Dysphagia, unspecified: Secondary | ICD-10-CM

## 2020-09-24 ENCOUNTER — Ambulatory Visit
Admission: RE | Admit: 2020-09-24 | Discharge: 2020-09-24 | Disposition: A | Discharge status: Home / Self Care | Payer: Self-pay | Source: Ambulatory Visit | Attending: Internal Medicine | Admitting: Internal Medicine

## 2020-09-24 DIAGNOSIS — R1310 Dysphagia, unspecified: Secondary | ICD-10-CM

## 2020-09-24 DIAGNOSIS — E892 Postprocedural hypoparathyroidism: Secondary | ICD-10-CM

## 2020-09-24 DIAGNOSIS — C73 Malignant neoplasm of thyroid gland: Secondary | ICD-10-CM

## 2020-10-08 ENCOUNTER — Ambulatory Visit: Payer: Self-pay | Admitting: Internal Medicine

## 2020-10-08 ENCOUNTER — Encounter: Payer: Self-pay | Admitting: Internal Medicine

## 2020-10-08 ENCOUNTER — Other Ambulatory Visit: Payer: Self-pay

## 2020-10-08 VITALS — BP 128/80 | HR 76 | Resp 12 | Ht 60.0 in | Wt 149.2 lb

## 2020-10-08 DIAGNOSIS — F32A Depression, unspecified: Secondary | ICD-10-CM

## 2020-10-08 DIAGNOSIS — F410 Panic disorder [episodic paroxysmal anxiety] without agoraphobia: Secondary | ICD-10-CM

## 2020-10-08 DIAGNOSIS — F411 Generalized anxiety disorder: Secondary | ICD-10-CM

## 2020-10-08 MED ORDER — CITALOPRAM HYDROBROMIDE 10 MG PO TABS: ORAL_TABLET | ORAL | 11 refills | Status: DC

## 2020-10-08 NOTE — Progress Notes (Signed)
    Subjective:    Patient ID: Mary Mcintosh, female   DOB: [birth year], 29 y.o.   MRN: [id number]   HPI   [place]  Anxiety/Panic Disorder/Depression:  Stopped counseling with Danton Clap, LCSW end of September.  States her job was taking up all her time and did not feel she had the time to do the counseling sessions.  She feels she is ready to make a change as her symptoms are worsening.  She is willing to work with Danton Clap LCSW as well as discuss medication.   No definite suicidal ideation, but concerned she will start thinking about that if she doesn't make a change.   Having panic attacks 3 times weekly lasting 30 minutes or so.  Had to leave work recently due to one episode recently.   Was treated end of last year with Sertraline.  Only took one dose and felt she was hallucinating and never took again.     Thyroid cancer history with thyroidectomy:  Working with Dr. Sharl Ma.  Levothyroxine increased 3 weeks ago to 112 mcg daily.    Current Meds  Medication Sig   levothyroxine (SYNTHROID) 112 MCG tablet Take 112 mcg by mouth daily before breakfast.   No Known Allergies   Review of Systems    Objective:   BP 128/80 (BP Location: Right Arm, Patient Position: Sitting, Cuff Size: Normal)   Pulse 76   Resp 12   Ht 5' (1.524 m)   Wt 149 lb 4 oz (67.7 kg)   LMP 09/23/2020 Comment: irregular.  Using condoms regularly  BMI 29.15 kg/m   Physical Exam  Depresses appearing.   Poor eye contact--looking at nails and floor much of visit. Picking all of fingernail polish off   Assessment & Plan   Depression/Panic Disorder 1 hour face to face with patient.   Citalopram 5 mg daily--start low dose and gradual increase with history of med intolerance Virtual visit in 1 week. Reestablish with Danton Clap, LCSW

## 2020-10-15 ENCOUNTER — Telehealth: Payer: Self-pay | Admitting: Internal Medicine

## 2020-10-15 NOTE — Telephone Encounter (Signed)
Patient called to let Dr. Delrae Alfred know that she's not feeling well (did not share details) and decided no to start the new medication. Patient will call us back to let us know when she starts taking the medicine so we can reschedule her 2 week follow up after new prescription.

## 2020-10-15 NOTE — Telephone Encounter (Signed)
Acute appt tomorrow please--will check her for COVID outside and if negative, consider bringing inside.

## 2020-10-20 ENCOUNTER — Other Ambulatory Visit: Payer: Self-pay

## 2020-10-20 ENCOUNTER — Ambulatory Visit: Payer: Self-pay | Admitting: Clinical

## 2020-10-20 ENCOUNTER — Telehealth: Payer: Self-pay | Admitting: Internal Medicine

## 2020-10-20 DIAGNOSIS — F32A Depression, unspecified: Secondary | ICD-10-CM

## 2020-10-20 DIAGNOSIS — F411 Generalized anxiety disorder: Secondary | ICD-10-CM

## 2020-10-20 DIAGNOSIS — F410 Panic disorder [episodic paroxysmal anxiety] without agoraphobia: Secondary | ICD-10-CM

## 2020-10-22 NOTE — Progress Notes (Signed)
THERAPY PROGRESS NOTE 10/21/2020  Session Time: 9:00-10:00 Participation Level: Active Behavioral Response: CasualAlertAnxious Type of Therapy: Individual Therapy Treatment Goals addressed: Anxiety and Coping   Purpose: LCSW met with client for routine individual therapy to work towards treatment goals: to learn coping skills to work towards managing anxiety symptoms.   Intervention: [place] SW met with client for routine individual therapy to work towards treatment goals: to learn coping skills to work towards managing anxiety symptoms. LCSW assessed for any significant events and how she is doing today. LCSW provided reflective listening skills as patient processed her response in regards to her mental health wellbeing. Patient shared how she has been trying to manage her symptoms recently. Patient identified strategies and motivation she wants to work towards. LCSW utilized intervention of CBT as patient identified her feelings and thoughts related to anxiety, LCSW challenged patient to think about she can challenge these feeling and thoughts. LCSW provided patient a homework assignment to work on her self care. LCSW assessed for SI/HI/command psychosis.  Effectiveness: Patient is alert x4 affect. Patient identified she is doing somewhat okay. Patient shared update she did start on her medication for anxiety. Patient processed she read the side effects of the medications and this alerted her. Patient shared she began to take them because she began to have rumination such as how worse could her anxiety become, what else is to come, and etc. Patient shared she began to recognize feelings of sadness as she thinks to herself why can't be overcome this, and seeing how her anxiety is starting to affect her overall lifestyle (work, relationships with her husband and her children). Patient reports unknown if the passing of her grandmother recently had any effect on her anxiety but because of her reframing she  feels she is taking it well, because she was able to go goodbye and had a positive relationship with her.   Patient reports since beginning her medication she only has headaches, and slight less panic. Patient reports she decided to begin to take them because she wants to get better, and she wants to enjoy things. Patient reports she begin to use skills discussed in session along with taking her medications. Patient reports she is beginning to take steps towards self care by informing her employers she will need to make adjustments to her schedule as she has worked 50+hrs in the past. Patient shared because of work and home life she has been unable to care for herself. Patient reports she is going to work towards returning to the gym as she actually enjoys being active and taking care of her body. Patient reports she also wants to return to church will provide her spiritual support, peace and calmness.  As processing in session, patient identified a person of support that can help her in her treatment is her sister. Patient reports her sisters thought process is to live in the moment day by day, patient reports she has trouble with this because of her constant fear of worse. Patient identified talking to her sister will help her challenge her maladaptive thoughts. Patient does note she stopped drinking alcohol, reports she had a scare when she had jittery feelings and some withdrawals. Reports by reflecting on her alcohol she recognized she was drinking to numb her feelings of anxiety and depressive symptoms.   Intervention was effective as patient is starting to recognize her anxiety, how it feels and looks like. It appears as patient is starting to recognize her anxiety and depressive symptoms  this is motivating her in regards to her treatment. Progress towards goal is Ongoing. Patient denied active suicidal/homicidal/active psychosis.  Plan Patient offered next appointment for: 11/03/2020  9am  Diagnosis: Generalized Anxiety Disorder w/panic & unspecified depressive disorder     Lujean Rave, LCSW 10/22/2020

## 2020-10-23 NOTE — Telephone Encounter (Signed)
Patient called and said that she feels better, she already had an appointment with Ile[facility] on Tuesday, 01/11/2.

## 2020-10-28 ENCOUNTER — Telehealth: Payer: Self-pay | Admitting: Internal Medicine

## 2020-10-29 ENCOUNTER — Telehealth: Payer: Self-pay | Admitting: Internal Medicine

## 2020-10-29 ENCOUNTER — Telehealth (INDEPENDENT_AMBULATORY_CARE_PROVIDER_SITE_OTHER): Payer: Self-pay | Admitting: Internal Medicine

## 2020-10-29 DIAGNOSIS — F32A Depression, unspecified: Secondary | ICD-10-CM | POA: Insufficient documentation

## 2020-10-29 DIAGNOSIS — F411 Generalized anxiety disorder: Secondary | ICD-10-CM

## 2020-10-29 DIAGNOSIS — F410 Panic disorder [episodic paroxysmal anxiety] without agoraphobia: Secondary | ICD-10-CM | POA: Insufficient documentation

## 2020-10-29 DIAGNOSIS — C73 Malignant neoplasm of thyroid gland: Secondary | ICD-10-CM

## 2020-10-29 NOTE — Progress Notes (Signed)
    Subjective:    Patient ID: Mary Mcintosh, female   DOB: April 06, 1991, 30 y.o.   MRN: [id number]   HPI   Virtual visit via [facility] interpreting.  1.  Papillary Thyroid Cancer:  Saw Dr. Buddy Duty, Endocrine, recently.  She did not understand a lot of what Dr. Buddy Duty discussed with her regarding recommended treatment of her papillary thyroid cancer.  Discussed why he recommended radioactive iodine treatment for her intermediate risk with minimal extrathyroidal extension of her tumor found in pathologic examination of her thyroid tumor tissue.    2.  Panic Disorder/GAD/Depression:  She is taking only 5 mg of Citalopram and is in her second week.  Had a mild headache in first week of use.  Has not noted any improvement in her anxiety issues nor depression.  Current Meds  Medication Sig  . citalopram (CELEXA) 10 MG tablet 1/2 tab by mouth daily  . levothyroxine (SYNTHROID) 112 MCG tablet Take 112 mcg by mouth daily before breakfast.   No Known Allergies   Review of Systems    Objective:   There were no vitals taken for this visit.  Physical Exam  Looks well   Assessment & Plan   1.  Papillary thyroid cancer:  Discussed reasons for RAI treatment to eradicate any thyroid cells, particularly any remnant of cancer cells.   She acknowledged understanding today with interpretation and prefers to proceed with the treatment rather than only monitoring with thyroglobulin and exam. Call into Dr. Cindra Eves assistant, Jonni Sanger, with message left on where they perform the treatment and if possible at a [facility] facility where she can apply for financial assistance.    2.  Panic Disorder/anxiety/depression:  Increase Citalopram to 10 mg daily.  Continue working with Dwan Bolt, LCSW.

## 2020-11-03 ENCOUNTER — Ambulatory Visit (INDEPENDENT_AMBULATORY_CARE_PROVIDER_SITE_OTHER): Payer: Self-pay | Admitting: Clinical

## 2020-11-03 DIAGNOSIS — F411 Generalized anxiety disorder: Secondary | ICD-10-CM

## 2020-11-03 DIAGNOSIS — F410 Panic disorder [episodic paroxysmal anxiety] without agoraphobia: Secondary | ICD-10-CM

## 2020-11-03 DIAGNOSIS — F32A Depression, unspecified: Secondary | ICD-10-CM

## 2020-11-03 NOTE — Progress Notes (Signed)
THERAPY PROGRESS NOTE  Session Time: 9:00-10:00 Participation Level: Active Behavioral Response: CasualAlertDepressed Type of Therapy: Individual Therapy Treatment Goals addressed: Coping skills to cope with anxiety and depression.    Purpose: LCSW met with client for routine individual therapy to work towards treatment goals: Coping skills to cope with anxiety and depression.    Intervention: LCSW met with client for routine individual therapy to work towards treatment goals: Coping skills to cope with anxiety and depression. LCSW met with patient to assess how she is doing since last visit, assess for any self care and any significant events. LCSW assess for any recent panic attacks. For this session LCSW provided a brief psychoeducation of depression compared to anxiety due to check in feeling. LCSW praised patient with her ability to challenge her maladapative thoughts patterns. LCSW assisted patient recognizing how her actions today challenged her depressed mood. LCSW and patient identified things that motivate her. Using this LCSW and patient identified ways to utilize this using CBT intervention to challenge her mood and behaviors due to depression. LCSW provided reflective listening as patient identified current stressor in her relationship with her husband and as she processed recent news related to her health. LCSW provided patient homework to take time to be able to go to her safe place or find out if her church is opened. LCSW assessed for SI/HI/command psychosis.  Effectiveness: Patient is alert x4 with a flat affect. At first patient reports feeling "ok" but when asked further to explore patient reported feeling low mood no motivation. Patient reports slight anxiety but has been challenging her negative thoughts. Patient reports for self care she did go to the gym. Reports her accountability to go to the gym was something she wanted to do. She reports went twice for about an hour and  half with her partner. Patient reports it felt nice to go.  Patient shared she is not sure as to what she is experiencing as these depressive symptoms are something new. Patient reports she feels like she does not know what else to do to now treat these symptoms. LCSW validated patient feelings and was able to provide education of these symptoms. LCSW ensured she is capable to treating her symptoms as she is doing it now with her anxiety. LCSW listed out loud her steps of this morning since she woke up by using CBT out loud how she is doing behavioral changes to challenge her depressive symptoms. LCSW had patient identify what is one of her motivations. Patient identified her spiritual belief is what motivates her. LCSW asked patient to explore the significance how her belief motivates her. LCSW had to ask her questions as patient responded as she had difficulties identifying further. LCSW was able to use her responses were the opposite to the symptoms she reported feeling. LCSW encouraged patient to use her spiritual beliefs to cope when she feels depressive symptoms. Patient shared she wants to go to church and shared she will do this as her homework.   Patient shared further factors that have increased her anxiety and mood has been the unexpected news related to her health. Patient shared knowing a cousin passed away from cancer too and she is fearful this could happen to her. Patient shared she feels very few support from her husband and family members to be able to process this at home, reports she is there for her family however they haven't. Patient shared her relationship with her husband there has been conflict due to his alcohol  use, patient reports she has discussed with him her concerns and there has been no response or changes. Patient shared she has told him if no changes then she wants a separation. Patient expressed gratitude to know she has a safe place to process any feelings.   Progress  towards goal is Ongoing. Patient denied active suicidal/homicidal/active psychosis.  Plan Patient offered next appointment for: 02/08 9am  Diagnosis: Generalized Anxiety Disorder w/panic  Unspecified depressive disorder   Lujean Rave, LCSW 11/03/2020

## 2020-11-17 ENCOUNTER — Telehealth: Payer: Self-pay | Admitting: Clinical

## 2020-11-17 ENCOUNTER — Other Ambulatory Visit: Payer: Self-pay | Admitting: Clinical

## 2020-11-17 NOTE — Telephone Encounter (Signed)
9:07: LCSW documenting note due to reschedule. LCSW called per pt. 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 LCSW informed due to being late and not rush it was recommended to reschedule. Pt agreed and rescheduled for Thursday at [facility].

## 2020-11-18 ENCOUNTER — Ambulatory Visit: Payer: Self-pay | Admitting: Internal Medicine

## 2020-11-19 ENCOUNTER — Ambulatory Visit: Payer: Self-pay | Admitting: Clinical

## 2020-11-19 ENCOUNTER — Other Ambulatory Visit: Payer: Self-pay

## 2020-11-19 DIAGNOSIS — F32A Depression, unspecified: Secondary | ICD-10-CM

## 2020-11-19 DIAGNOSIS — F410 Panic disorder [episodic paroxysmal anxiety] without agoraphobia: Secondary | ICD-10-CM

## 2020-11-19 DIAGNOSIS — F411 Generalized anxiety disorder: Secondary | ICD-10-CM

## 2020-11-23 NOTE — Progress Notes (Unsigned)
THERAPY PROGRESS NOTE  Session Time: 9-10am Participation Level: Active Behavioral Response: Casual and Fairly GroomedAlertEuthymic Type of Therapy: Individual Therapy Treatment Goals addressed: Coping skills to cope with anxiety and depression.   Purpose: LCSW met with client for routine individual therapy to work towards treatment goals: Coping skills to cope with anxiety and depression.  Intervention: LCSW met with patient for routine individual therapy to continue to work towards treatment goals: Coping skills to cope with anxiety and depression. LCSW provided patient opportunity to check in from previous session and assess for any significant events and she is doing today. LCSW asked to check in if she did her self care activities noted from last week. LCSW utilized intervention of CBT as patient identified strategies she is practicing to decrease the intensity of her anxiety and depression episodes. Patient was able to share practicing boundaries with her family to practice her self care (see effectiveness). LCSW provided reflective listening as patient identified thoughts of wanting to return to her home country as she feels unable to adjust to here. LCSW validated her feelings and encouraged to use her memories of her home to cope with her anxiety or depressive symptoms as her home provides her comfort of happiness. LCSW assessed for SI/HI/command psychosis.  Effectiveness: Patient is alert x4 affect. Patient reports today she is feeling good. Patient reports she continues to take her medication for her anxiety. Patient reports fewer panic attacks "I can feel it but I feel it less." Patient reports still experiencing some low days. Patient reports she mindfully "leaves these thoughts there" and has been going to the gym to help with her distraction.  Patient reports she has not gone to church as she wants however when asked how she has been managing her work hours patient reports she now  has two days off including today. Patient shared it felt good asking for days off due to her wellbeing and need to prioritize herself first. Patient shared her children's response to taking day off gave her motivation to ask for time off.  Reports today she will take care of some things at home and she is looking forward to spending times with her kids this afternoon.   Patient reports during session wanting to discuss her thoughts of going back to her home country. Patient reports even though she moved to the [place] in 2007 she hasn't felt she has adapted to here. Patient reports she misses the freedom and liberty from her home. Patient reports she feels like she is in a cycle of routine work, home, sleep, and work again. Patient processed events that led to her moving to here. Patient reports she is grateful for the education her children are receiving here but life in general she would like to move back. Reports she has talked to her children and husband about this idea, her husband hasn't completed denied the idea but to her it sounds like he has adapted to here and her children want to stay here. Patient processed her life as a child back home and these memories bring her calmness and happiness. Patient agreed to explore this idea to use as a coping skill when she is experiencing depression/anxiety.    Intervention was effective as patient was able to reflect, participate and processed her feelings and thoughts. Progress towards goal is Ongoing. Patient denied active suicidal/homicidal/active psychosis.  Plan Patient offered next appointment for: 03/03 9am  Diagnosis: Generalized Anxiety Disorder    Lujean Rave, LCSW 11/23/2020

## 2020-11-24 ENCOUNTER — Ambulatory Visit: Payer: Self-pay | Admitting: Internal Medicine

## 2020-11-26 ENCOUNTER — Telehealth: Payer: Self-pay | Admitting: Internal Medicine

## 2020-12-10 ENCOUNTER — Ambulatory Visit: Payer: Self-pay | Admitting: Clinical

## 2020-12-10 ENCOUNTER — Other Ambulatory Visit: Payer: Self-pay

## 2020-12-10 DIAGNOSIS — F410 Panic disorder [episodic paroxysmal anxiety] without agoraphobia: Secondary | ICD-10-CM

## 2020-12-10 DIAGNOSIS — F411 Generalized anxiety disorder: Secondary | ICD-10-CM

## 2020-12-14 NOTE — Progress Notes (Signed)
   THERAPY PROGRESS NOTE  Session Time: 9:00-10am  Participation Level: Active Behavioral Response: CasualAlertEuthymic Type of Therapy: Individual Therapy Treatment Goals addressed: Anxiety and Coping   Purpose: LCSW met with client for routine individual therapy to work towards treatment goals: coping with anxiety symptoms   Intervention: LCSW met with client for routine individual therapy to work towards treatment goals: coping with anxiety symptoms. LCSW provided patient opportunity to check in to assess for any significant events and how she is doing today. LCSW utilized intervention of CBT by providing psychoeducation coping skill of Tapping (emotional freedom technique) from  https://www.thetappingsolution.com/tapping-101/ which reviews steps and techniques to reduce stress, and anxiety symptoms as well as other benefits. LCSW and patient completed this exercise together in session. LCSW asked patient to identify an example that could cause anxiety and rate. Following practice of exercise asked patient to identify new rating.  LCSW assessed for SI/HI/command psychosis.  Effectiveness: Patient is alert x4 affect. Patient reports during check in today she is feeling good. When asked to elaborate this feeling patient identified feeling happy. Patient reports not as much anxiety/panics. Patient reports she can tell her medications are being effective as when there is sensations of anxiety they feel less intense. Patient reports she hasn't been able to go to church as she would've liked however she has been able to go the gym to exercise. Reports noticing some weight change physically and can feel this is also playing a factor to feeling good mentally.   Patient did note dizziness out of the now where, and can occur at any time of the day. Usually causes when she is standing up has not noted when she is driving. Reports it can be consistent for 2 days when it happens. When asked if it is  immediately following her medications, reports it can occur hours later following her medication intake.  Patient was active in the session as she participated in the tapping. Patient identified stressful related situation would be work, and rated it could be a 3. Patient was able to identify acceptance affirmation. When completed patient reports exercise would be helpful but unable to determine a new rating as she wasn't feeling very anxious. Patient agreed to try exercise at home.  Intervention was effective as patient was able to participate and engaged in the session. Progress towards goal is Ongoing. Patient denied active suicidal/homicidal/active psychosis.  Plan Patient offered next appointment for: 03/22 9am  Diagnosis: Generalized Anxiety Disorder w/panic    Lujean Rave, LCSW 12/14/2020

## 2020-12-17 ENCOUNTER — Telehealth (INDEPENDENT_AMBULATORY_CARE_PROVIDER_SITE_OTHER): Payer: Self-pay | Admitting: Internal Medicine

## 2020-12-17 DIAGNOSIS — F410 Panic disorder [episodic paroxysmal anxiety] without agoraphobia: Secondary | ICD-10-CM

## 2020-12-17 DIAGNOSIS — F32A Depression, unspecified: Secondary | ICD-10-CM

## 2020-12-17 DIAGNOSIS — C73 Malignant neoplasm of thyroid gland: Secondary | ICD-10-CM

## 2020-12-17 DIAGNOSIS — F411 Generalized anxiety disorder: Secondary | ICD-10-CM

## 2020-12-17 NOTE — Progress Notes (Signed)
    Subjective:    Patient ID: Mary Mcintosh, female   DOB: 10/09/1991, 30 y.o.   MRN: [id number]   HPI   Unable to connect with audio on [employer] so switched to telephone only.  Anxiety and depression:  Has been taking Citalopram 10 mg since about 10/29/20.  Felt the anxiety was improved for about 1 month, but now feels about the same in past couple of weeks. Was having fewer panic attacks and in general, felt less anxious.  Panic attacks were down to once weekly--actually just felt one was beginning and would stop before becoming full blown. Now back to 2-3 times weekly--past 3 weeks.   She also just has some underlying general anxiety in past 3 weeks.   Headaches are now not very frequent. Dizziness is more frequent however--this is a chronic issue.  Wonders if her 2 large cups of coffee may be making her feel poorly with dizziness.    History of thyroid cancer:  Discussed spoke with Dr. Buddy Duty after her last visit and he was okay with continuing to follow with labs if she did not want to go through with RAI treatment.  Discussed the high costs of doing the treatment without having to be off thyroid hormone--vs. The lower cost of coming off and being very hypothyroid for a while and disability with that.  Current Meds  Medication Sig  . citalopram (CELEXA) 10 MG tablet 1/2 tab by mouth daily  . levothyroxine (SYNTHROID) 112 MCG tablet Take 112 mcg by mouth daily before breakfast.   No Known Allergies   Review of Systems    Objective:   There were no vitals taken for this visit.  Physical Exam   Assessment & Plan  1.  Anxiety/depression:  Increase Citalopram to 15 mg for a week and if tolerating, to increase to 20 mg.  To call if side effects. Follow up in 1 month.  2.  Dizziness/headaches:  Discussed weaning coffee intake to see is she feels better off caffeine.  3.  Thyroid cancer:  She will think about what Dr. Cindra Eves recommendations are and discuss with  him with her follow up later in year.

## 2020-12-29 ENCOUNTER — Ambulatory Visit: Payer: Self-pay | Admitting: Clinical

## 2020-12-29 DIAGNOSIS — F411 Generalized anxiety disorder: Secondary | ICD-10-CM

## 2020-12-29 DIAGNOSIS — F410 Panic disorder [episodic paroxysmal anxiety] without agoraphobia: Secondary | ICD-10-CM

## 2020-12-29 DIAGNOSIS — F32A Depression, unspecified: Secondary | ICD-10-CM

## 2020-12-29 NOTE — Progress Notes (Signed)
   THERAPY PROGRESS NOTE  Session Time: 9-10am Participation Level: Active Behavioral Response: CasualAlertEuthymic Type of Therapy: Individual Therapy Treatment Goals addressed: Anxiety and Coping   Purpose: LCSW met with client for routine individual therapy to work towards treatment goals: anxiety and coping.   Intervention: LCSW met with client for routine individual therapy to work towards treatment goals: anxiety and coping. For first part of session LCSW utilized as a check in as patient has not been seen in over 3 weeks. LCSW asked patient variety to assess how she is doing and what she has done on her own. LCSW assessed how her visit with PCP proceeded. For this session intervention of CBT to discuss making changes in daily life to work towards decrease anxiety/depressive symptoms and also physical changes for health. LCSW introduced for next session to utilize [facility] grounding exercises to also help with her anxiety. LCSW assessed for SI/HI/command psychosis.  Effectiveness: Patient is alert x4 affect. Patient reports today "bien" (good). When asked again she reported somewhat good. Patient reports overall is feeling good occasional low days reports mostly just stress related to little things such as home, work, get together's, etc. Patient reports she continues to go to the gym and on Saturdays will spend time with her family, and recently attending parties. Patient reports she hasn't gone to her church still because of spending time with family. Patient reports she will ask her kids if they want to go with her eventually.     Patient reports continuing to take her medications and was able to report effectiveness of change but concern why those low days. LCSW validated patients concern and informed of anxiety/depression can present that way it is what we do about it. Patient acknowledged she does get herself up and motivates herself to move from her anxiety/depression.   Patient and LCSW  discussed changes to her daily life such as coffee intake. Patient notes increase of anxiety however does find it difficult to stop it. Patient shared coffee is one of her traditions to drink in the mornings, it is available at her work and helps to keep going. Patient does note estimated 2-3 cups a day. Patient reports acknowledgement it isn't healthy but is unsure how to make changes but wants too. LCSW asked how she identified an approach to reducing her alcohol and encouraged to try the same with her coffee/bread intake. Patient acknowledged she likes her coffee and can see the cons to it. LCSW encouraged to think of ways for accountability such as had 3 cups, add 3 more exercises to the gym/or other ideas.   To summarize session discussed to treat anxiety is of looking at perspective, that although change can be scary anxiety can also motivate to work towards feeling better and not letting it consume one. LCSW guided patient to use these changes in approach to her coffee intake.   Intervention was effective as patient was active in session and able to acknowledge where to make changes and how. Patient agree to try new EDMR training skills of LCSW recent training. Progress towards gotieal is Ongoing. Patient denied active suicidal/homicidal/active psychosis.  Plan Patient offered next appointment for: 04/05 9am  Diagnosis: Generalized Anxiety Disorder    Lujean Rave, LCSW 12/29/2020

## 2021-01-12 ENCOUNTER — Other Ambulatory Visit: Payer: Self-pay | Admitting: Clinical

## 2021-02-18 ENCOUNTER — Encounter: Payer: Self-pay | Admitting: Internal Medicine

## 2021-02-18 ENCOUNTER — Ambulatory Visit: Payer: Self-pay | Admitting: Internal Medicine

## 2021-02-18 ENCOUNTER — Other Ambulatory Visit: Payer: Self-pay

## 2021-02-18 VITALS — BP 100/68 | HR 60 | Resp 12 | Ht 60.5 in | Wt 149.5 lb

## 2021-02-18 DIAGNOSIS — F410 Panic disorder [episodic paroxysmal anxiety] without agoraphobia: Secondary | ICD-10-CM

## 2021-02-18 DIAGNOSIS — N926 Irregular menstruation, unspecified: Secondary | ICD-10-CM

## 2021-02-18 DIAGNOSIS — F411 Generalized anxiety disorder: Secondary | ICD-10-CM

## 2021-02-18 DIAGNOSIS — F32A Depression, unspecified: Secondary | ICD-10-CM

## 2021-02-18 DIAGNOSIS — N946 Dysmenorrhea, unspecified: Secondary | ICD-10-CM

## 2021-02-18 MED ORDER — CITALOPRAM HYDROBROMIDE 10 MG PO TABS: ORAL_TABLET | ORAL | 11 refills | Status: DC

## 2021-02-18 NOTE — Progress Notes (Signed)
Subjective:    Patient ID: Mary Mcintosh, female   DOB: 1991/05/29, 30 y.o.   MRN: [id number]   HPI   Interpreted by Arlana Lindau  GAD/Depression:  Taking Citalopram 15 mg daily.  Panic attacks are better.  Decreased thoughts of desperation and bad thoughts.  Estimates having panic attacks once in a month and episode less severe.  She tried to titrate up to 20 mg daily, but did not feel well --sleepy and not herself.  Tried the 20 mg only one day.   She has not been able to afford working with Danton Clap, LCSW since end of March, but plans to restart.   Her female partner has noted she is doing better.  Her siblings, with whom she works, have noted a change for the better. Does feel better on the 15 mg.  Problems with periods. Irregular and lasting up to 5 days.  She doesn't feel well starting about 2 days before period begins. Cramping and aching and feels feverish at times.   Not having vaginal discharge.  States sometimes bleeds twice monthly.  Can be just spotting or full length with 5 days--previously only 2 days.  Started in months before thyroid surgery. Has irregular periods maybe for 2 months out of past 6 months. Menarche at 30 years of age. Regular periods "until all this happened"  Thyroid surgery and anxiety she clarifies is "all this happened" 11/04/19 was when she underwent total thyroidectomy. Problems with periods started about 3 months before. Weight has remained fairly stable. She feels she has lost 10 lbs since starting the Citalopram in December, though that does not appear to be the case looking back at her weights in the clinic.  She has taken unknown BCPs before for max of 2 months--periods were more irregular and just had dark spotting, which concerned her.    Using condoms for [facility] currently--consistent with use and intercourse. She does not use tobacco. No history of clotting disorder.     Goes to [facility] for her pap and pelvic  exam.    Current Meds  Medication Sig   citalopram (CELEXA) 10 MG tablet 1/2 tab by mouth daily   levothyroxine (SYNTHROID) 112 MCG tablet Take 112 mcg by mouth daily before breakfast.   No Known Allergies   Review of Systems    Objective:   BP 100/68 (BP Location: Right Arm, Patient Position: Sitting, Cuff Size: Normal)   Pulse 60   Resp 12   Ht 5' 0.5" (1.537 m)   Wt 149 lb 8 oz (67.8 kg)   LMP 02/08/2021 (Exact Date)   BMI 28.72 kg/m   Physical Exam NAD Lungs:  CTA CV:  RRR without murmur or rub.  Abd:  S, NT No HSM or mass. + BS GU:  Normal external female genitalia.  No uterine or adnexal mass or tenderness. No vaginal discharge or cervical/vaginal mucosa lesion   Assessment & Plan   Depression GAD, Panic Disorder:  Try Citalopram at 15 mg daily and see if tolerates with improved control at that dose.  Call if problem  2.  Dysmenorrhea:  if does not want to try a different BCP, recommend ibuprofen or naproxen with regular dosing in days leading up to menstrual flow and throughout menses.  Take with food

## 2021-03-04 ENCOUNTER — Ambulatory Visit: Payer: Self-pay | Admitting: Clinical

## 2021-03-04 ENCOUNTER — Other Ambulatory Visit: Payer: Self-pay

## 2021-03-04 DIAGNOSIS — F410 Panic disorder [episodic paroxysmal anxiety] without agoraphobia: Secondary | ICD-10-CM

## 2021-03-04 DIAGNOSIS — F411 Generalized anxiety disorder: Secondary | ICD-10-CM

## 2021-03-09 NOTE — Progress Notes (Signed)
   THERAPY PROGRESS NOTE  Session Time: 9-10am Participation Level: Active Behavioral Response: CasualAlertEuthymic Type of Therapy: Individual Therapy Treatment Goals addressed: Anxiety and Coping   Purpose: LCSW met with client for routine individual therapy to work towards treatment goals: coping skills to manage anxiety.   Intervention: LCSW met with client for routine individual therapy to continue to work towards treatment goals. Due to length of time since last session LCSW utilized this session as a check in of updates of how she has been, any medication side effects, and recent coping skills. LCSW provided reflective listening as patient shared how she is doing.  LCSW utilized this session if there was anything she wanted to process in session. LCSW provided patient opportunity to discuss and assess for any updated treatment goals. LCSW asked patient for homework to identify all the things she hopes to reach/complete whether little or big. LCSW informed after listing them to take a break as our minds can be tricky. LCSW assessed for SI/HI/command psychosis.  Effectiveness: Patient is alert x4 affect. Patient reports today she is doing better. Patient reports feels medications have been helpful, she reports she continues to try her best to thrive. Patient reports she hasn't been able to go to church as she has planned reports other things have prevented from her going such as spending time with her family or etc. Patient report she still plans on attending. Patient reports recent event that she wanted to process was her relationship with her husband. Patient reports she explained to her children they were considering separation. Patient shared she found out something that also occurred in the past and his continued drinking. Patient reports she feels proud of herself for standing up for herself and her self worth. Patient reports self esteem is great and expressed understanding of taking care  of herself. Patient reports currently her husband and her working through their marriage.  Patient reports what she would like to address going forward is exploring what she wants to do, would like to begin by doing goal setting and keeping up with it. 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 Patient agreed to do the homework assignment to begin to brainstorm steps to work towards the goals.   Intervention was effective as patient was able to reflect and process her emotions and how she has been able to cope with it. Progress towards goal is Ongoing. Patient denied active suicidal/homicidal/active psychosis.  Plan Patient offered next appointment for: 06/09 9am  Diagnosis: Generalized Anxiety Disorder w/panic    Lujean Rave, LCSW 03/09/2021

## 2021-03-18 ENCOUNTER — Other Ambulatory Visit: Payer: Self-pay | Admitting: Clinical

## 2021-04-27 IMAGING — CR DG CHEST 2V
2 series · 2 of 2 positions shown · non-contrast
Comparison: 11/14/2018

CLINICAL DATA: Preoperative, thyroid surgery

EXAM:
CHEST - 2 VIEW

[w chest pa]
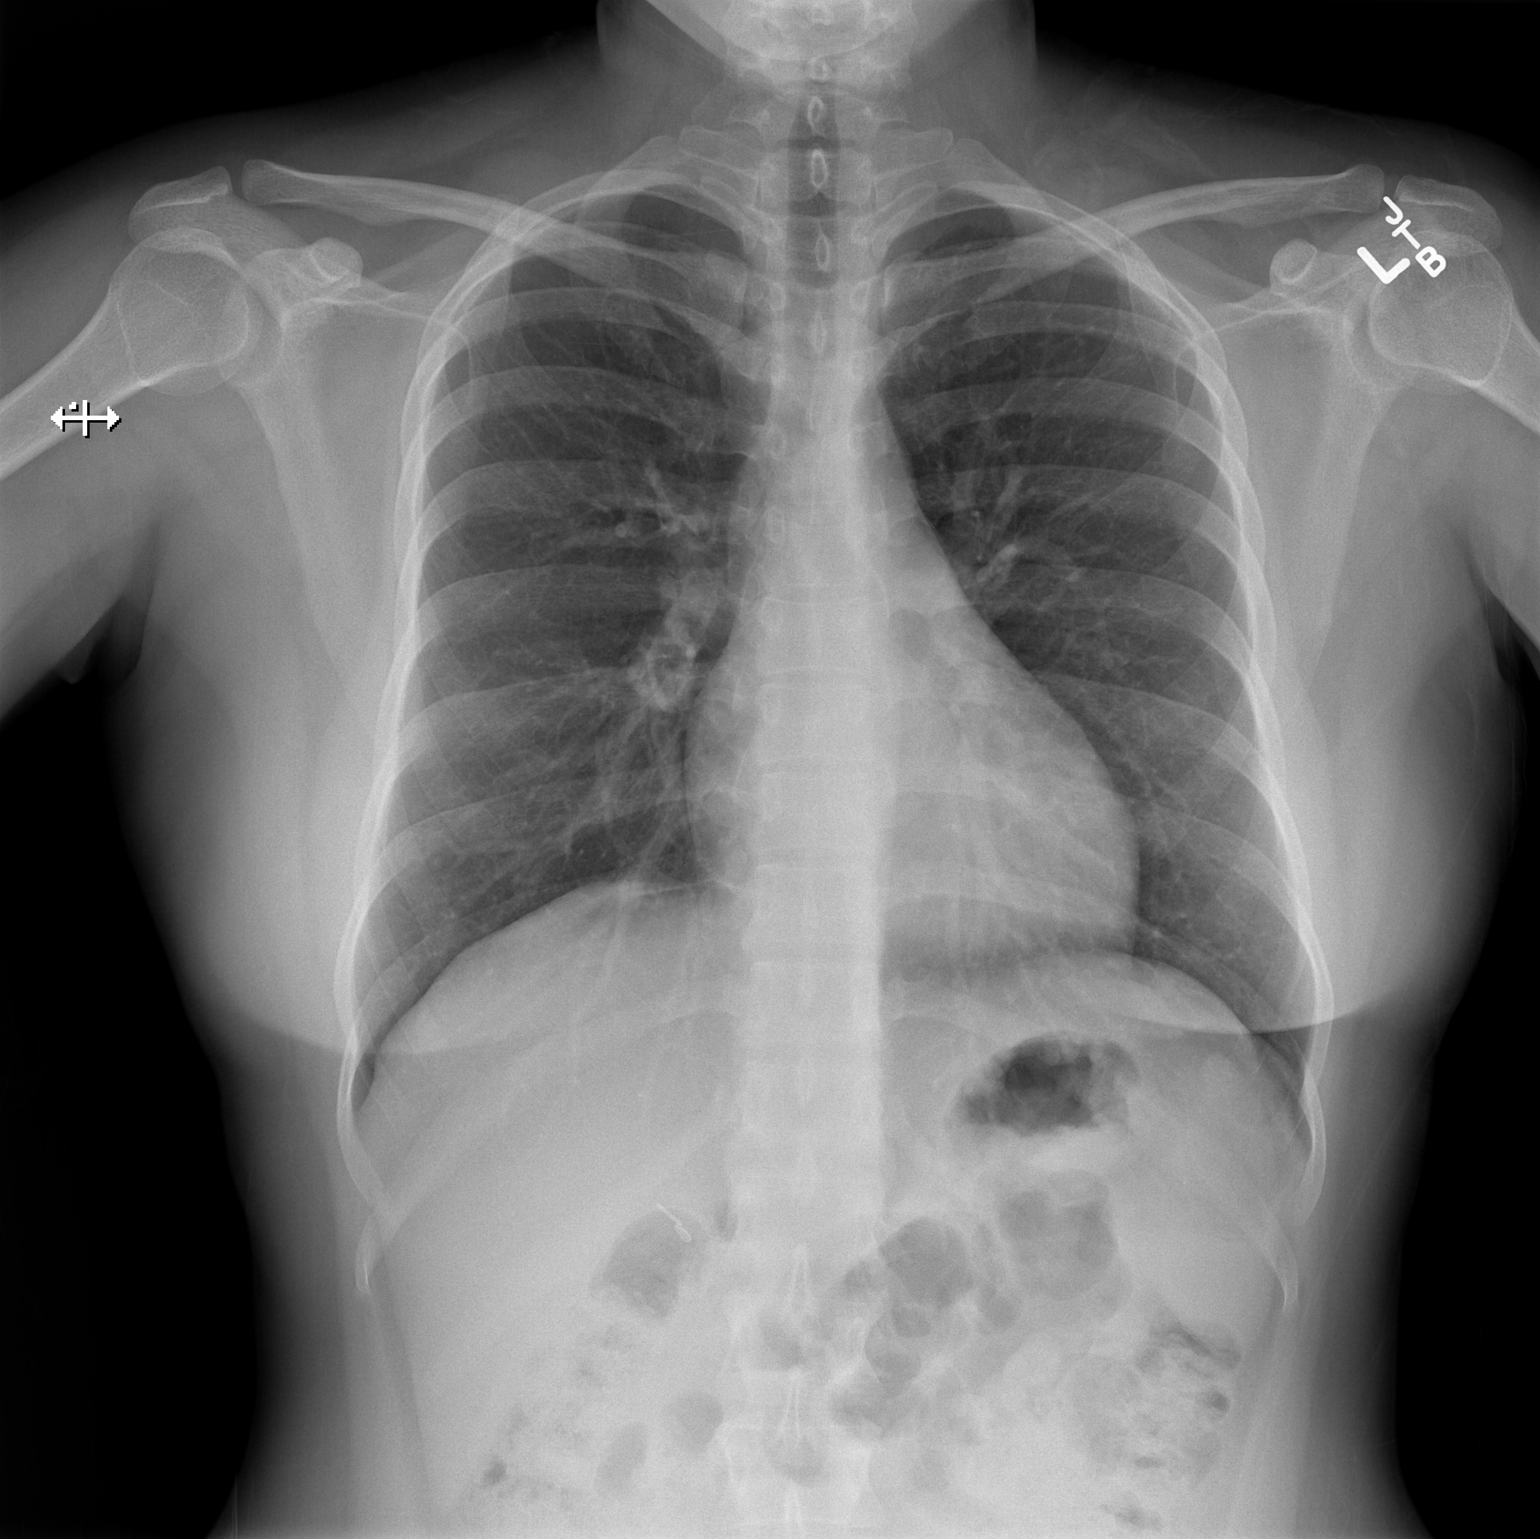

[w chest lat]
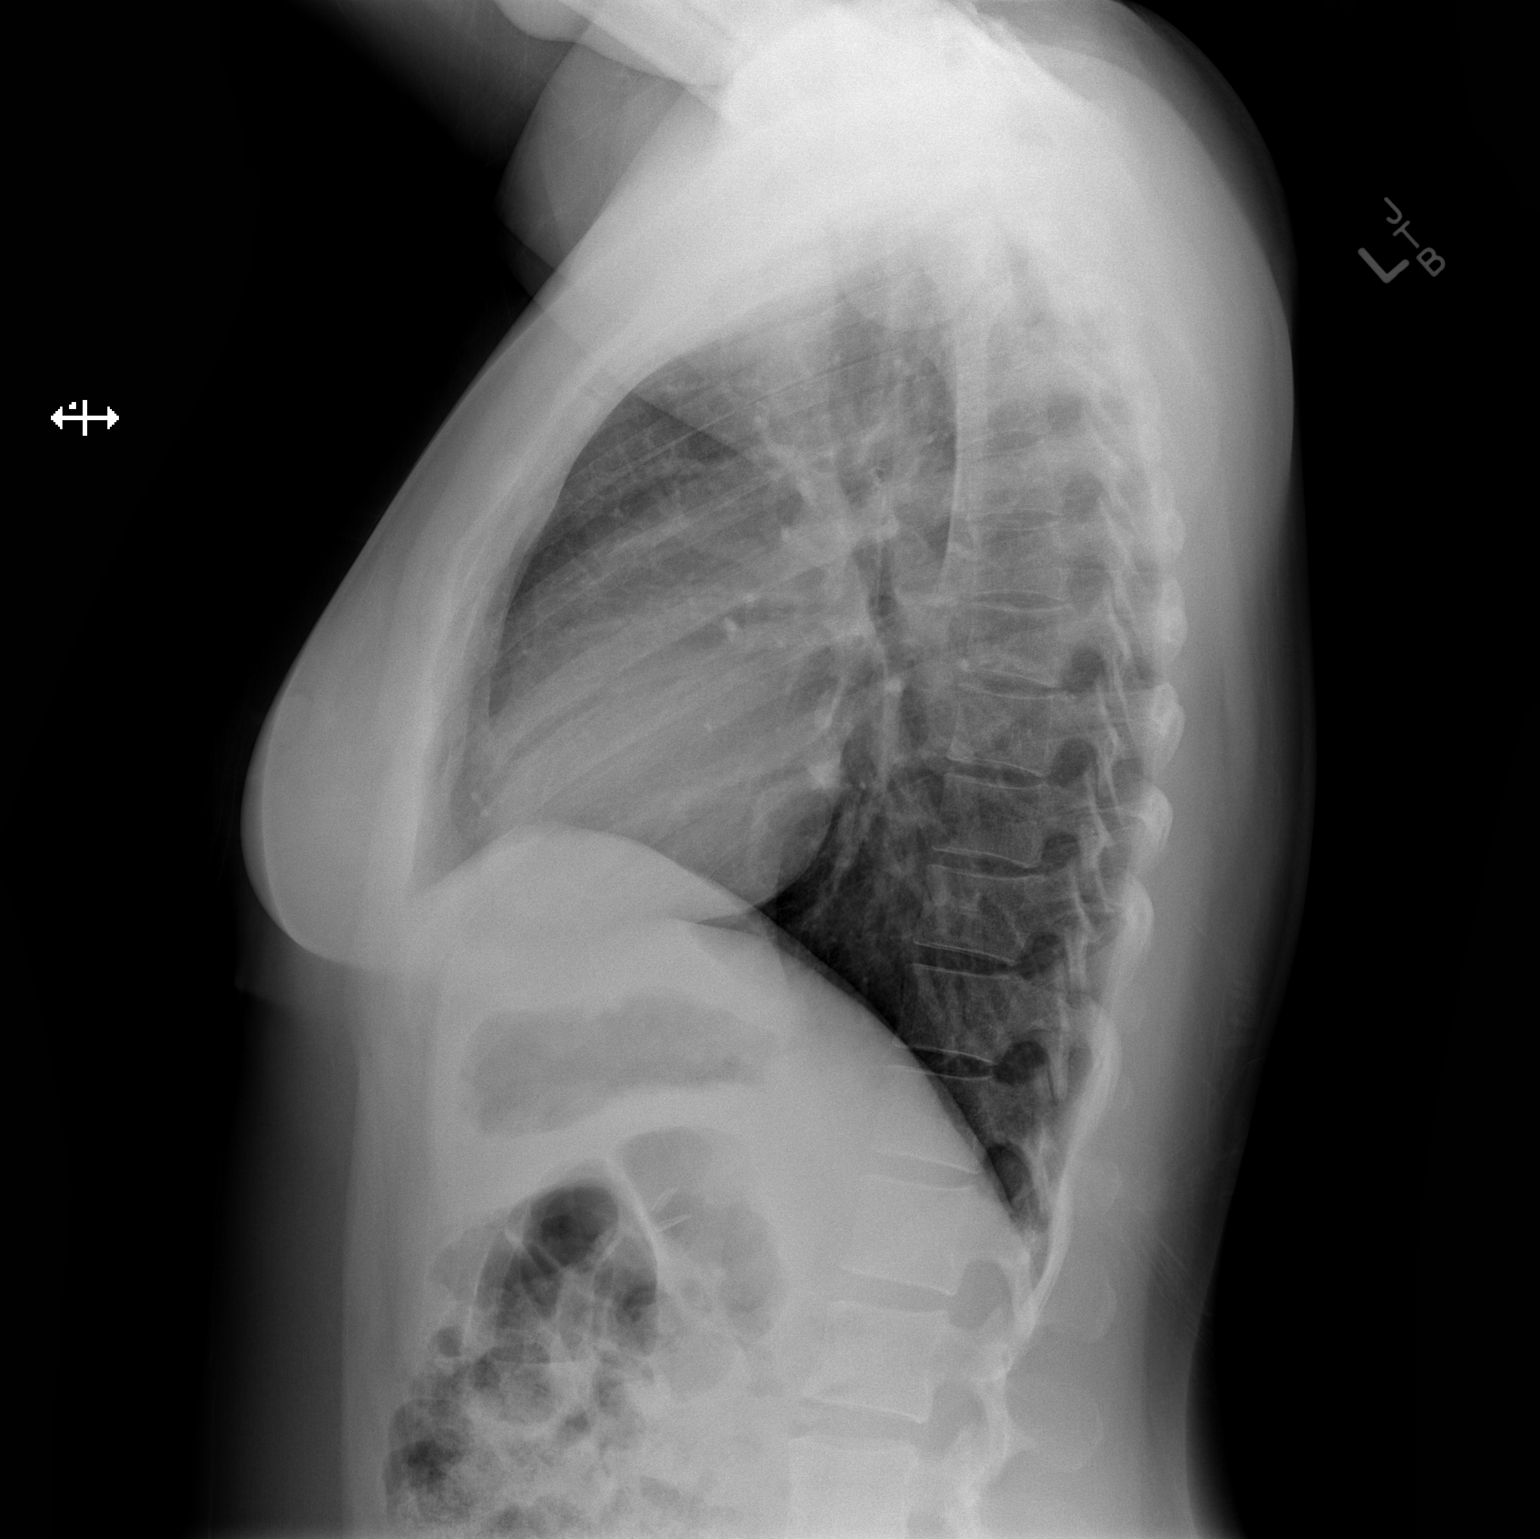

[2 of 2 positions shown; findings below may reference images not displayed]

FINDINGS: The heart size and mediastinal contours are within normal limits.
Both lungs are clear. The visualized skeletal structures are
unremarkable.
IMPRESSION: No acute abnormality of the lungs.

## 2021-06-24 ENCOUNTER — Ambulatory Visit: Payer: Self-pay | Admitting: Internal Medicine

## 2021-07-05 ENCOUNTER — Encounter: Payer: Self-pay | Admitting: Internal Medicine

## 2021-07-05 ENCOUNTER — Other Ambulatory Visit: Payer: Self-pay

## 2021-07-05 ENCOUNTER — Ambulatory Visit: Payer: Self-pay | Admitting: Internal Medicine

## 2021-07-05 VITALS — BP 110/70 | HR 64 | Resp 12 | Ht 60.0 in | Wt 149.5 lb

## 2021-07-05 DIAGNOSIS — F411 Generalized anxiety disorder: Secondary | ICD-10-CM

## 2021-07-05 DIAGNOSIS — F32A Depression, unspecified: Secondary | ICD-10-CM

## 2021-07-05 DIAGNOSIS — F410 Panic disorder [episodic paroxysmal anxiety] without agoraphobia: Secondary | ICD-10-CM

## 2021-07-05 MED ORDER — CITALOPRAM HYDROBROMIDE 10 MG PO TABS: ORAL_TABLET | ORAL | 11 refills | Status: DC

## 2021-07-05 NOTE — Patient Instructions (Signed)
Call if problems with change in med or if happy with 20 mg and want to switch to 20 mg tab for cost savings.

## 2021-07-05 NOTE — Progress Notes (Signed)
    Subjective:    Patient ID: Mary Mcintosh, female   DOB: 02/25/1991, 30 y.o.   MRN: [id number]   HPI  Karen Kays interprets  Depression/anxiety/panic disorder:  Good days and bad days.  Symptoms in general are better, less severe than before.  Bad days:  anxious, poor energy, sad, hopeless, without a reason.  Can last generally around 3 days for a total of 7 days out of week.  No thoughts of suicide.  Has been taking only 15 mg of Citalopram since May.  Felt "drugged" previously when took for just one day.  She states on days she forgets to take the medicine, she does not feel well.   She did feel working with Dwan Bolt, LCSW was helpful.  She does not feel she has time currently for restarting counseling.      2.  HM:  Had side effects with 2nd dose of Moderna:  body aches, fatigue for a couple of days.  3.  Hx of thyroid cancer:  Had symptoms of too much hormone and decreased from 112 to 100 mcg daily.  Does not note a huge difference.  Not sure whether symptoms due to anxiety or hyperthyroidism at times.  Plans for follow up labs soon.    No Known Allergies   Review of Systems    Objective:   BP 110/70 (BP Location: Right Arm, Patient Position: Sitting, Cuff Size: Normal)   Pulse 64   Resp 12   Ht 5' (1.524 m)   Wt 149 lb 8 oz (67.8 kg)   BMI 29.20 kg/m   Physical Exam NAD.  Appears calmer Neck:  Supple, No adenopathy Chest:  CTA CV:  RRR without murmur or rub.  Radial pulses normal and equal    Assessment & Plan    Anxiety/Depression/Panic Disorder:  increase Citalopram to 20 mg daily.  Will see if tolerates better this time.  Can always return to 15 mg if not, but feel she needs a higher dose.  Encouraged to reconsider counseling.    2.  Hypothyroidism:  Plans for repeat TSH on lower dose of levothyroxine with Endocrine soon.  3.  HM:  she will consider Moderna biphasic at next appt or when has time in near future.

## 2021-09-09 ENCOUNTER — Ambulatory Visit: Payer: Self-pay | Admitting: Internal Medicine

## 2021-09-13 ENCOUNTER — Other Ambulatory Visit: Payer: Self-pay

## 2021-09-13 ENCOUNTER — Encounter: Payer: Self-pay | Admitting: Internal Medicine

## 2021-09-13 ENCOUNTER — Telehealth: Payer: Self-pay

## 2021-09-13 ENCOUNTER — Ambulatory Visit: Payer: Self-pay | Admitting: Internal Medicine

## 2021-09-13 VITALS — BP 130/100 | HR 72 | Resp 16 | Ht 60.0 in | Wt 145.5 lb

## 2021-09-13 DIAGNOSIS — R0689 Other abnormalities of breathing: Secondary | ICD-10-CM

## 2021-09-13 DIAGNOSIS — J9801 Acute bronchospasm: Secondary | ICD-10-CM

## 2021-09-13 LAB — POCT INFLUENZA A/B
Influenza A, POC: NEGATIVE
Influenza B, POC: NEGATIVE

## 2021-09-13 LAB — POC COVID19 BINAXNOW: SARS Coronavirus 2 Ag: NEGATIVE

## 2021-09-13 MED ORDER — ALBUTEROL SULFATE (2.5 MG/3ML) 0.083% IN NEBU
2.5000 mg | INHALATION_SOLUTION | Freq: Once | RESPIRATORY_TRACT | Status: AC
  Administered 2021-09-13: 2.5 mg via RESPIRATORY_TRACT

## 2021-09-13 MED ORDER — ALBUTEROL SULFATE (2.5 MG/3ML) 0.083% IN NEBU: INHALATION_SOLUTION | RESPIRATORY_TRACT | 1 refills | Status: DC

## 2021-09-13 MED ORDER — AZITHROMYCIN 500 MG PO TABS: ORAL_TABLET | ORAL | 0 refills | Status: DC

## 2021-09-13 NOTE — Telephone Encounter (Signed)
Appt made for 09/13/21

## 2021-09-13 NOTE — Telephone Encounter (Signed)
Pt called to report that she has been sick for a week. Has had fever with body/headache for 2 days. Has had congestions and chest pain that makes it hard to breath for 3 days. Has been taking amoxicillin for 3days, not prescribed. Did a home Covid test 2 days ago and was negative. Would like recommendations or appt.  I asked for her to stop taken the antibiotic for the moment while I spoke to Dr Amil Amen. Told to get medication for symptoms like tylenol.

## 2021-09-13 NOTE — Progress Notes (Signed)
    Subjective:    Patient ID: Geralyn Flash, female   DOB: [birth year], 30 y.o.   MRN: [id number]   HPI  Karen Kays interprets  1 week of body aches, fever.  3 days ago, started feeling her chest was heavy.  Her chest fatigues when she tries to walk, as if she is not breathing well. Two days ago, she was feeling very poorly.  She asked a family friend who is a retired Tax adviser, to take a look at her.  He did and apparently gave her two injections of ?Amoxicillin, 500 mg.  She was also given oral Amoxicillin, 500 mg 3 tabs, which she took 1 tab 3 times daily yesterday. Last fever was 4 days ago.  She doesn't have fever or bodyaches any longer, just having difficulties feeling she cannot breathe appropriately--fatigued.   Occasional cough as feels she needs to cough up phlegm.  When she has brought up phlegm, it is clear.   No N, V, or diarrhea. 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   She is not aware of anyone testing positive for anything.  Taking unknown Robitussin for congestion and cough.   Has taken both ibuprofen and tylenol--last dose of either was 3 days ago.      Current Meds  Medication Sig   amoxicillin (AMOXIL) 500 MG capsule Take 500 mg by mouth in the morning, at noon, and at bedtime. Took it for one day   citalopram (CELEXA) 10 MG tablet 2 tabs by mouth once daily   levothyroxine (SYNTHROID) 100 MCG tablet Take 100 mcg by mouth daily before breakfast.   No Known Allergies   Review of Systems    Objective:   BP (!) 130/100 (BP Location: Left Arm, Patient Position: Sitting, Cuff Size: Normal)   Pulse 72   Resp 16   Ht 5' (1.524 m)   Wt 145 lb 8 oz (66 kg)   LMP 08/24/2021 (Within Days)   BMI 28.42 kg/m   Physical Exam NAD HEENT:  PERRL, EOMI, NT over sinuses. TMs pearly gray, throat without injection. Neck:  Supple, No adenopathy Chest:  faint end exp wheeze anterior right lung field.  No crackles.   CV:  RRR  without murmur or rub. Normal radial and DP pulses. Abd:  S, NT, No HSM or mass, + BS LE:  No edema. Albuterol neb 2.5 mg:  improved airflow following treatment.  Perhaps some wheezing in right posterior lower lung field  SaO2 remains at 99%  POCT Covid,  Influenza A & B negative Assessment & Plan   Viral vs atypical respiratory infection with mild bronchospesm,negative for COVId and influenza.   Start Azithromycin 500 mg daily for 5 days.   Puch fluids, coo mist humidifier Albuterol ampule samples provided and to use every 6 hours as needed. Follow up kn 2 dayswith nebulizer and for follow up.

## 2021-09-15 ENCOUNTER — Ambulatory Visit: Payer: Self-pay

## 2021-09-17 ENCOUNTER — Other Ambulatory Visit (INDEPENDENT_AMBULATORY_CARE_PROVIDER_SITE_OTHER): Payer: Self-pay | Admitting: Internal Medicine

## 2021-09-17 ENCOUNTER — Other Ambulatory Visit: Payer: Self-pay

## 2021-09-17 DIAGNOSIS — J9801 Acute bronchospasm: Secondary | ICD-10-CM

## 2021-09-17 DIAGNOSIS — R0689 Other abnormalities of breathing: Secondary | ICD-10-CM

## 2021-09-17 NOTE — Progress Notes (Signed)
    Subjective:    Patient ID: Mary Mcintosh, female   DOB: 1991-09-20, 30 y.o.   MRN: [id number]   HPI  Has not needed albuterol neb since first 2 days after being seen.  Feeling better without dyspnea.  Current Meds  Medication Sig   azithromycin (ZITHROMAX) 500 MG tablet 1 tab by mouth daily for 5 days.   citalopram (CELEXA) 10 MG tablet 2 tabs by mouth once daily   levothyroxine (SYNTHROID) 100 MCG tablet Take 100 mcg by mouth daily before breakfast.   No Known Allergies   Review of Systems    Objective:   LMP 08/24/2021 (Within Days)   Physical Exam Lungs:  CTA CV: RRR without murmur or rub.     Assessment & Plan   Possible atypical bronchitis with bronchospasm:  last day of Azithromycin.  Call if symptoms recur.  Looks well.

## 2021-10-22 ENCOUNTER — Other Ambulatory Visit: Payer: Self-pay

## 2021-10-22 ENCOUNTER — Encounter: Payer: Self-pay | Admitting: Internal Medicine

## 2021-10-22 ENCOUNTER — Ambulatory Visit: Payer: Self-pay | Admitting: Internal Medicine

## 2021-10-22 VITALS — BP 132/76 | HR 76 | Resp 12 | Ht 60.0 in | Wt 148.5 lb

## 2021-10-22 DIAGNOSIS — F410 Panic disorder [episodic paroxysmal anxiety] without agoraphobia: Secondary | ICD-10-CM

## 2021-10-22 DIAGNOSIS — E890 Postprocedural hypothyroidism: Secondary | ICD-10-CM | POA: Insufficient documentation

## 2021-10-22 DIAGNOSIS — F32A Depression, unspecified: Secondary | ICD-10-CM

## 2021-10-22 DIAGNOSIS — F411 Generalized anxiety disorder: Secondary | ICD-10-CM

## 2021-10-22 MED ORDER — CITALOPRAM HYDROBROMIDE 20 MG PO TABS: ORAL_TABLET | ORAL | 11 refills | Status: DC

## 2021-10-22 NOTE — Progress Notes (Signed)
° ° °  Subjective:    Patient ID: Mary Mcintosh, female   DOB: 1991/05/12, 31 y.o.   MRN: [id number]   HPI  [name] interprets  Anxiety/depression/Panic disorder:  increased to 20 mg of Citalopram in September.  Feels much better with increased dose.  States symptoms of anxiety, poor energy, sadness and hopelessness without a reason she can define are infrequent and she feels the level of anxiety, sadness she now has are normal fluctuations of mood.    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  Recheck of TSH reportedly in a good range with last labs.    Bronchospasm/bronchitis:  doing well after treatment in December.    Current Meds  Medication Sig   Ascorbic Acid (VITAMIN C) 100 MG tablet Take 100 mg by mouth daily. Unsure of dosage   Cholecalciferol (VITAMIN D3) 1.25 MG (50000 UT) CAPS Take by mouth. Unsure of dosage   citalopram (CELEXA) 10 MG tablet 2 tabs by mouth once daily   levothyroxine (SYNTHROID) 100 MCG tablet Take 100 mcg by mouth daily before breakfast.   No Known Allergies   Review of Systems    Objective:   BP 132/76 (BP Location: Right Arm, Patient Position: Sitting, Cuff Size: Normal)    Pulse 76    Resp 12    Ht 5' (1.524 m)    Wt 148 lb 8 oz (67.4 kg)    LMP 10/19/2021    BMI 29.00 kg/m   Physical Exam NAD, normal affect.  Good eye contact Lungs:  CTA CV:  RRR without murmur or rub.  Radial pulses normal and equal   Assessment & Plan    Panic Disorder/Depression/GAD:  much improved with increase to 20 mg Citalopram.  Change to 20 mg tabs.  Discussion of waiting until end of Sept 2023 before considering taper if she so prefers, but to consider indefinite treatment with Citalopram as well as she has required and escalation in dosing and is now doing well.  2.  Post operative hypothyroidism with recent decrease in dosing.  Doing well with current dose.  Has yearly follow up now with Dr.  Buddy Duty.  3.  Bronchitis:  resolved   4.  HM:  Did receive Tdap in 2014--not clear why removed from chart.  She will consider Moderna COVID bivalent booster in coming week, but did not want today.  Encouraged influenza vaccine at pharmacy of choice, which we no longer have available.

## 2022-03-22 IMAGING — US US THYROID
1 series · 14 of 20 positions shown · non-contrast
Comparison: 06/04/2019; ultrasound-guided fine-needle aspiration of
left thyroid nodule-07/18/2019

CLINICAL DATA: Other. History of papillary thyroid cancer post
total thyroidectomy. Difficulty swallowing.

EXAM:
THYROID ULTRASOUND
TECHNIQUE: Ultrasound examination of the thyroid gland and adjacent soft
tissues was performed.

[Series 1: us thyroid · 0.08mm/px · 14 of 20 slices shown]
[im 1/20]
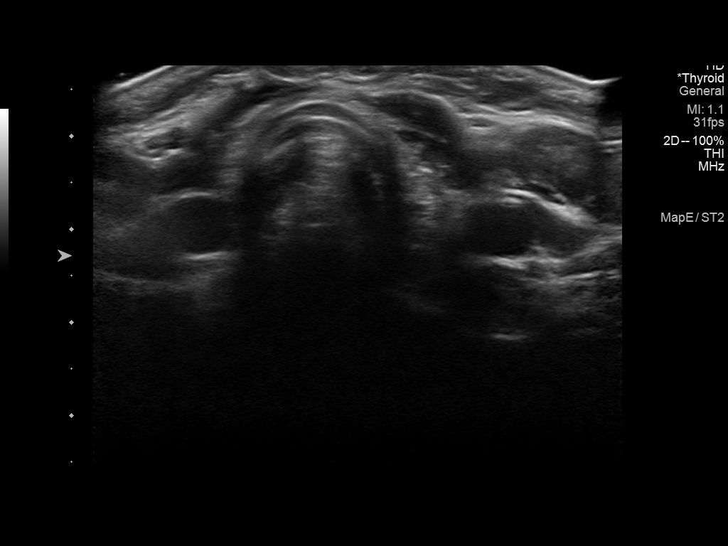
[im 3/20]
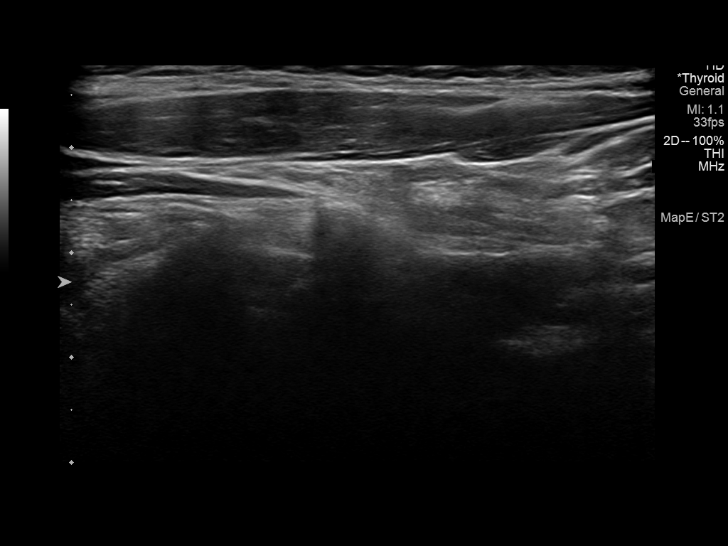
[im 4/20]
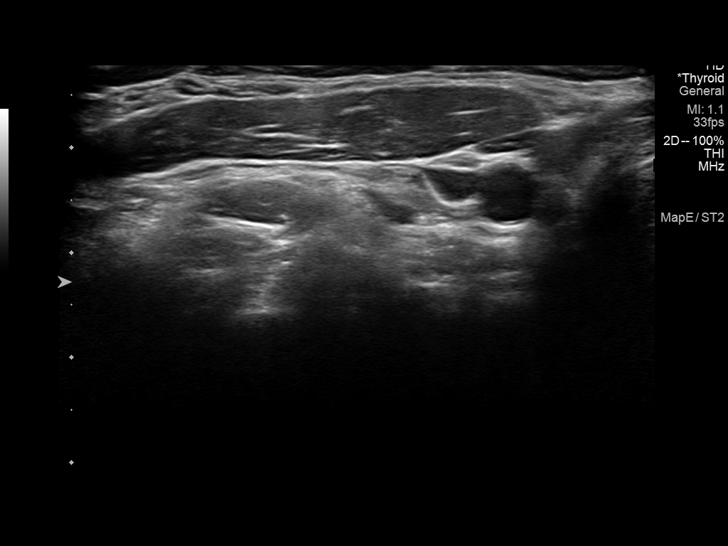
[im 6/20]
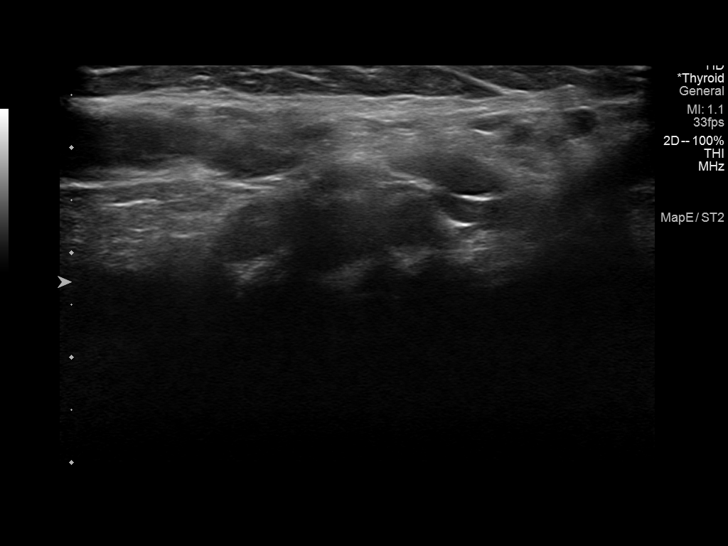
[im 7/20]
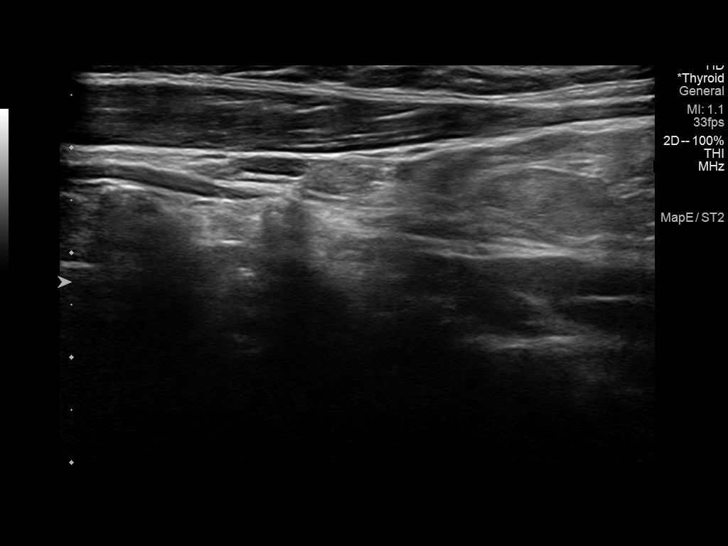
[im 8/20]
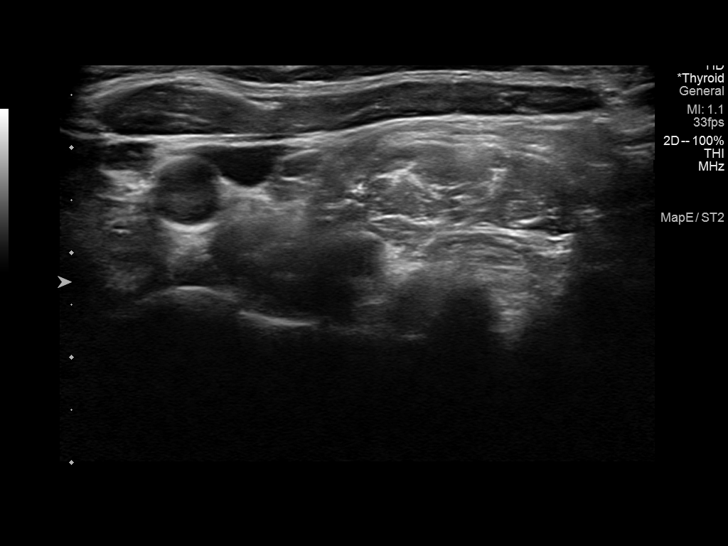
[im 10/20]
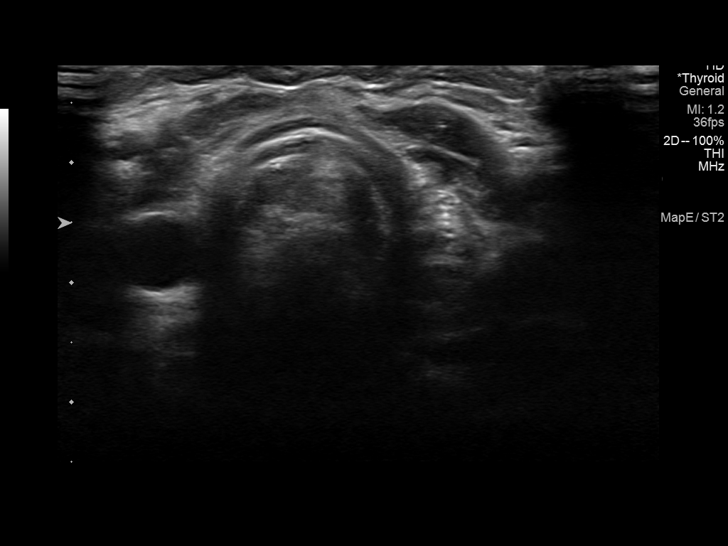
[im 11/20]
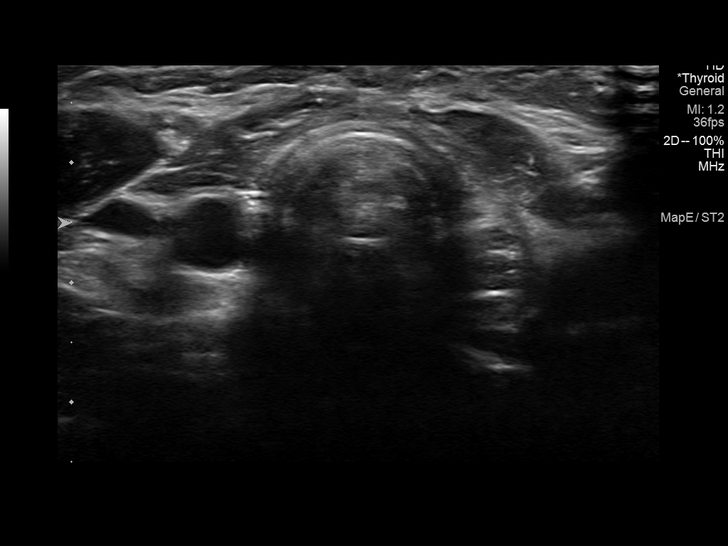
[im 13/20]
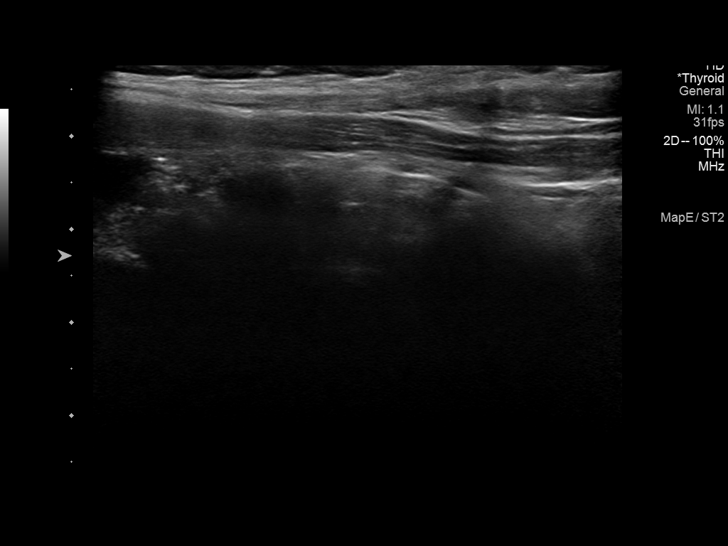
[im 14/20]
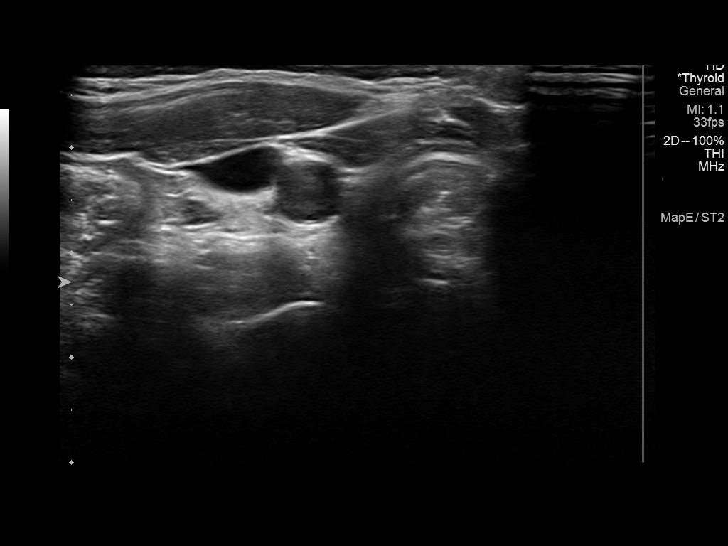
[im 16/20]
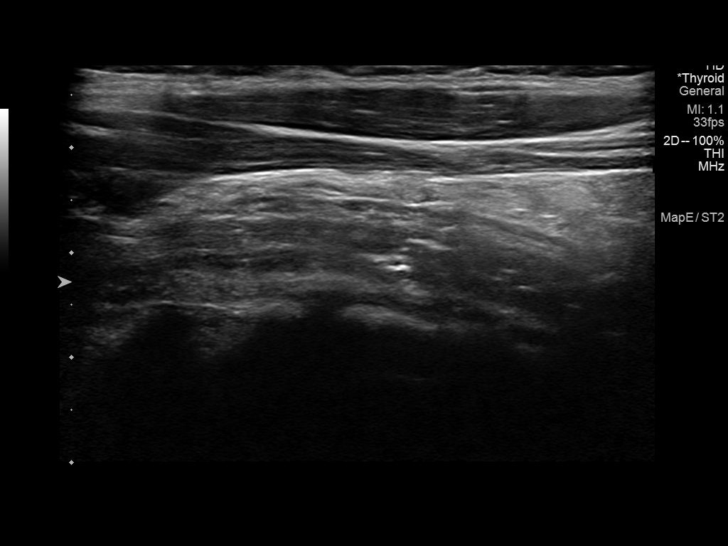
[im 17/20]
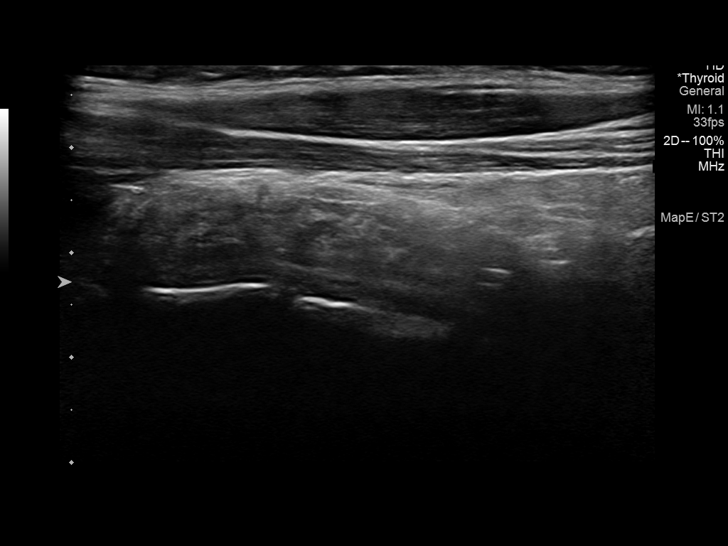
[im 18/20]
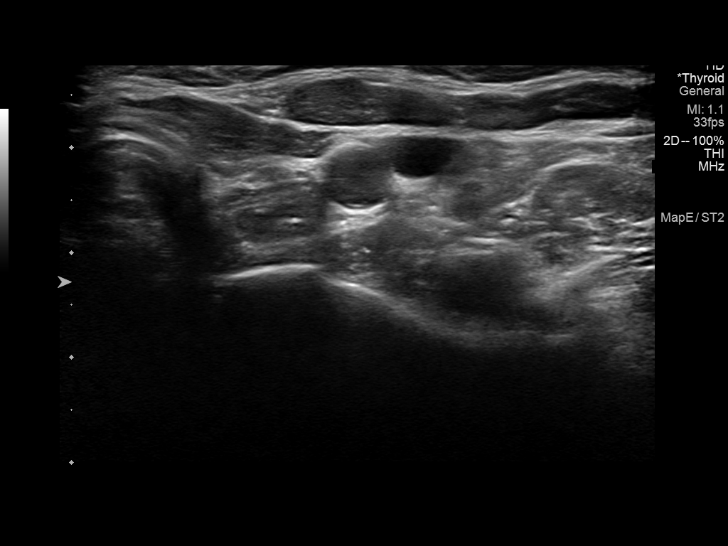
[im 20/20]
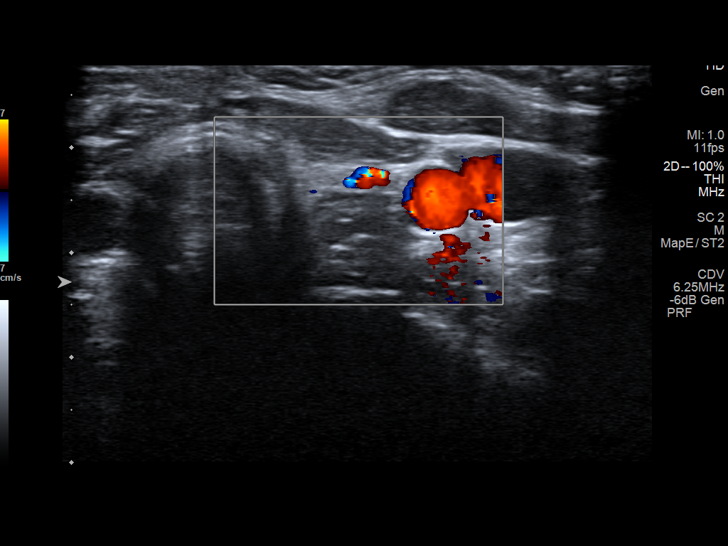

[14 of 20 positions shown; findings below may reference images not displayed]

FINDINGS: Isthmus: Surgically absent. There is no residual nodular soft tissue
within the isthmic resection bed.

Right lobe: Surgically absent. There is no residual nodular soft
tissue within the right lobectomy resection bed.

Left lobe: Surgically absent. There is no residual nodular soft
tissue within the left lobectomy resection bed.

_________________________________________________________

No regional cervical lymphadenopathy.
IMPRESSION: Post total thyroidectomy without evidence of residual or locally
recurrent disease.

## 2022-04-19 ENCOUNTER — Other Ambulatory Visit: Payer: Self-pay

## 2022-04-21 ENCOUNTER — Ambulatory Visit: Payer: Self-pay | Admitting: Internal Medicine

## 2022-04-21 ENCOUNTER — Other Ambulatory Visit: Payer: Self-pay | Admitting: Internal Medicine

## 2022-04-21 ENCOUNTER — Encounter: Payer: Self-pay | Admitting: Internal Medicine

## 2022-04-21 VITALS — BP 103/68 | HR 76 | Resp 12 | Ht 60.0 in | Wt 153.0 lb

## 2022-04-21 DIAGNOSIS — Z0000 Encounter for general adult medical examination without abnormal findings: Secondary | ICD-10-CM

## 2022-04-21 DIAGNOSIS — Z124 Encounter for screening for malignant neoplasm of cervix: Secondary | ICD-10-CM

## 2022-04-21 NOTE — Progress Notes (Signed)
Subjective:    Patient ID: Mary Mcintosh, female   DOB: [birth year], 31 y.o.   MRN: [id number]   HPI  CPE with pap  1.  Pap:  Previously, paps performed at [facility].  Had an abnormal pap when in her teens.  Normalized without treatment.   2.  Mammogram:  History of mamm[place] being [place] a teenager due to painful, hard breasts.  No concerning findings and felt secondary to pubertal changes.  History of a first cousin developing breast cancer at age 38.    3.  Osteoprevention:  Drinks almond or coconut milk once daily.  Does eat cheese and yogurt occasionally.  Feels she can drink 3 cups of almond milk daily. Planning to get back to gym--treadmill, machine circuit, free weights.    4.  Guaiac Cards/FIT:  Never.    5.  Colonoscopy:  Never.  No family history of colon cancer.    6.  Immunizations:   Immunization History  Administered Date(s) Administered   Influenza Split 08/16/2012   Influenza,inj,Quad PF,6+ Mos 08/07/2019   Moderna Sars-Covid-2 Vaccination 03/02/2020, 03/30/2020   Tdap 06/06/2013     7.  Glucose/Cholesterol:  Cholesterol not checked since 2013.  Blood glucose fine in past.    Current Meds  Medication Sig   Ascorbic Acid (VITAMIN C) 100 MG tablet Take 100 mg by mouth daily. Unsure of dosage   Cholecalciferol (VITAMIN D3) 1.25 MG (50000 UT) CAPS Take by mouth. Unsure of dosage   citalopram (CELEXA) 20 MG tablet 1 tab by mouth daily   levothyroxine (SYNTHROID) 100 MCG tablet Take 100 mcg by mouth daily before breakfast.   No Known Allergies  Past Medical History:  Diagnosis Date   Anxiety ~ 2009   "once; only for a short time" (08/15/2012)   Anxiety    "I had anxiety and was prescribed medications, but I stopped taking the medications because it made me feel worse" (10/31/2019)   Depression ~ 2009   "once; only for a short time" (08/15/2012)   Migraines    "I think they're migraines, I get them often" (08/15/2012)    Pancreatitis 08/15/2012   Pre-eclampsia 2012   with last child    Swelling of lower limb    "both legs; when I work" (08/15/2012)   Varicose veins of both lower extremities with pain    "when I work" (08/15/2012)   Past Surgical History:  Procedure Laterality Date   CHOLECYSTECTOMY  08/18/2012   Procedure: LAPAROSCOPIC CHOLECYSTECTOMY WITH INTRAOPERATIVE CHOLANGIOGRAM;  Surgeon: Axel Filler, MD;  Location: [facility] OR;  Service: General;  Laterality: N/A;   THYROIDECTOMY N/A 11/04/2019   Procedure: TOTAL THYROIDECTOMY;  Surgeon: Serena Colonel, MD;  Location: [facility] OR;  Service: ENT;  Laterality: N/A;   Family History  Problem Relation Age of Onset   Hyperlipidemia Mother    GER disease Mother    Alcohol abuse Father    Cirrhosis Father    Social History   Socioeconomic History   Marital status: Media planner    Spouse name: Not on file   Number of children: 2   Years of education: 9th   Highest education level: Not on file  Occupational History   Occupation: Physicist, medical Employee/cook  Tobacco Use   Smoking status: Never    Passive exposure: Never   Smokeless tobacco: Never   Tobacco comments:    "i've never smoked" 10/31/19  Vaping Use   Vaping Use: Never used  Substance and Sexual Activity   Alcohol use: Yes    Comment: rare   Drug use: No   Sexual activity: Yes    Birth control/protection: Condom  Other Topics Concern   Not on file  Social History Narrative   Lives in [place] with her boyfriend and their two children   Social Determinants of Health   Financial Resource Strain: Low Risk  (04/21/2022)   Overall Financial Resource Strain (CARDIA)    Difficulty of Paying Living Expenses: Not hard at all  Food Insecurity: No Food Insecurity (04/21/2022)   Hunger Vital Sign    Worried About Running Out of Food in the Last Year: Never true    Ran Out of Food in the Last Year: Never true  Transportation Needs: No Transportation Needs (04/21/2022)   PRAPARE -  Administrator, Civil Service (Medical): No    Lack of Transportation (Non-Medical): No  Physical Activity: Not on file  Stress: Stress Concern Present (02/28/2020)   [employer] of Occupational Health - Occupational Stress Questionnaire    Feeling of Stress : Rather much  Social Connections: Socially Isolated (02/28/2020)   Social Connection and Isolation Panel [NHANES]    Frequency of Communication with Friends and Family: Once a week    Frequency of Social Gatherings with Friends and Family: Once a week    Attends Religious Services: Never    Database administrator or Organizations: No    Attends Banker Meetings: Never    Marital Status: Living with partner  Intimate Partner Violence: Not At Risk (02/28/2020)   Humiliation, Afraid, Rape, and Kick questionnaire    Fear of Current or Ex-Partner: No    Emotionally Abused: No    Physically Abused: No    Sexually Abused: No      Review of Systems  Psychiatric/Behavioral:  The patient is not nervous/anxious (Panic Disorder/GAD:  Not sure she wants to consider titrating off her Citalopram as discussed in past.  Also concerned if she should get pregnant on the medication.  Discussed.).       Objective:   BP 103/68 (BP Location: Right Arm, Patient Position: Sitting, Cuff Size: Normal)   Pulse 76   Resp 12   Ht 5' (1.524 m)   Wt 153 lb (69.4 kg)   LMP 04/04/2022 (Within Days)   BMI 29.88 kg/m   Physical Exam HENT:     Head: Normocephalic and atraumatic.     Right Ear: Tympanic membrane, ear canal and external ear normal.     Left Ear: Tympanic membrane, ear canal and external ear normal.     Nose: Nose normal.     Mouth/Throat:     Mouth: Mucous membranes are moist.     Pharynx: Oropharynx is clear.  Eyes:     Extraocular Movements: Extraocular movements intact.     Conjunctiva/sclera: Conjunctivae normal.     Pupils: Pupils are equal, round, and reactive to light.     Comments: Discs sharp   Neck:     Thyroid: No thyroid mass or thyromegaly (surgical scar, anterior neck base.).  Cardiovascular:     Rate and Rhythm: Normal rate and regular rhythm.     Pulses: Normal pulses.     Heart sounds: S1 normal and S2 normal. No murmur heard.    No friction rub. No S3 or S4 sounds.  Pulmonary:     Effort: Pulmonary effort is normal.     Breath sounds: Normal breath sounds and  air entry.  Chest:  Breasts:    Right: No inverted nipple, mass or nipple discharge.     Left: No inverted nipple, mass or nipple discharge.  Abdominal:     General: Bowel sounds are normal.     Palpations: Abdomen is soft. There is no hepatomegaly, splenomegaly or mass.     Tenderness: There is no abdominal tenderness.     Hernia: No hernia is present.  Genitourinary:    Comments: Normal external female genitalia No cervical lesion No uterine or adnexal mass or tenderness. Musculoskeletal:        General: Normal range of motion.     Cervical back: Normal range of motion and neck supple.     Right lower leg: No edema.     Left lower leg: No edema.  Feet:     Right foot:     Skin integrity: Skin integrity normal.     Toenail Condition: Right toenails are normal.     Left foot:     Skin integrity: Skin integrity normal.     Toenail Condition: Left toenails are normal.  Lymphadenopathy:     Head:     Right side of head: No submental or submandibular adenopathy.     Left side of head: No submental or submandibular adenopathy.     Cervical: No cervical adenopathy.     Upper Body:     Right upper body: No supraclavicular or axillary adenopathy.     Left upper body: No supraclavicular or axillary adenopathy.     Lower Body: No right inguinal adenopathy. No left inguinal adenopathy.  Skin:    General: Skin is warm.     Capillary Refill: Capillary refill takes less than 2 seconds.     Findings: No rash.  Neurological:     General: No focal deficit present.     Mental Status: She is alert and oriented  to person, place, and time.     Cranial Nerves: Cranial nerves 2-12 are intact.     Sensory: Sensation is intact.     Motor: Motor function is intact.     Coordination: Coordination is intact.     Gait: Gait is intact.     Deep Tendon Reflexes: Reflexes are normal and symmetric.  Psychiatric:        Mood and Affect: Mood normal.        Speech: Speech normal.        Behavior: Behavior normal. Behavior is cooperative.    Assessment & Plan    CPE with pap. Not sure about Moderna Bivalent today Fasting labs tomorrow:  FLP, CBC, CMP, TSH  2.  History of thyroid cancer and postoperative hypothyroidism:   follow up with Dr. Sharl Ma, Endocrine next month.  3.  Depression/GAD/Panic Disorder:  controlled on Citalopram.  Discussed possible fetal complications and choice is ultimately hers should she decide to get pregnant, but that many choose to stay on the medication during pregnancy and often no concerning findings with newborn/child.  She has done well with Citalopram.  Otherwise, discussed absolutely fine to stay on Citalopram indefinitely.

## 2022-04-22 ENCOUNTER — Other Ambulatory Visit: Payer: Self-pay

## 2022-04-22 LAB — CYTOLOGY - PAP

## 2022-04-28 ENCOUNTER — Telehealth: Payer: Self-pay

## 2022-04-28 NOTE — Telephone Encounter (Signed)
Patient would like appt or rx after experiencing lower back right back pain. Started today 04/28/22 and is reoccurring. Has pulsing pain. Has not happened in the past. Patient took ibuprofen which has not helped.

## 2022-05-03 ENCOUNTER — Other Ambulatory Visit: Payer: Self-pay

## 2022-05-03 DIAGNOSIS — E890 Postprocedural hypothyroidism: Secondary | ICD-10-CM

## 2022-05-03 DIAGNOSIS — Z0000 Encounter for general adult medical examination without abnormal findings: Secondary | ICD-10-CM

## 2022-05-04 LAB — CBC WITH DIFFERENTIAL/PLATELET
Basophils Absolute: 0.1 10*3/uL (ref 0.0–0.2)
Basos: 1 %
EOS (ABSOLUTE): 0.1 10*3/uL (ref 0.0–0.4)
Eos: 1 %
Hematocrit: 39.1 % (ref 34.0–46.6)
Hemoglobin: 12.9 g/dL (ref 11.1–15.9)
Immature Grans (Abs): 0 10*3/uL (ref 0.0–0.1)
Immature Granulocytes: 0 %
Lymphocytes Absolute: 2.5 10*3/uL (ref 0.7–3.1)
Lymphs: 30 %
MCH: 30.2 pg (ref 26.6–33.0)
MCHC: 33 g/dL (ref 31.5–35.7)
MCV: 92 fL (ref 79–97)
Monocytes Absolute: 0.5 10*3/uL (ref 0.1–0.9)
Monocytes: 6 %
Neutrophils Absolute: 4.9 10*3/uL (ref 1.4–7.0)
Neutrophils: 62 %
Platelets: 294 10*3/uL (ref 150–450)
RBC: 4.27 x10E6/uL (ref 3.77–5.28)
RDW: 11.8 % (ref 11.7–15.4)
WBC: 8.1 10*3/uL (ref 3.4–10.8)

## 2022-05-04 LAB — COMPREHENSIVE METABOLIC PANEL
ALT: 11 IU/L (ref 0–32)
AST: 12 IU/L (ref 0–40)
Albumin/Globulin Ratio: 1.7 (ref 1.2–2.2)
Albumin: 4.5 g/dL (ref 3.9–4.9)
Alkaline Phosphatase: 64 IU/L (ref 44–121)
BUN/Creatinine Ratio: 16 (ref 9–23)
BUN: 10 mg/dL (ref 6–20)
Bilirubin Total: 0.4 mg/dL (ref 0.0–1.2)
CO2: 20 mmol/L (ref 20–29)
Calcium: 9.2 mg/dL (ref 8.7–10.2)
Chloride: 104 mmol/L (ref 96–106)
Creatinine, Ser: 0.63 mg/dL (ref 0.57–1.00)
Globulin, Total: 2.7 g/dL (ref 1.5–4.5)
Glucose: 90 mg/dL (ref 70–99)
Potassium: 4.3 mmol/L (ref 3.5–5.2)
Sodium: 137 mmol/L (ref 134–144)
Total Protein: 7.2 g/dL (ref 6.0–8.5)
eGFR: 120 mL/min/{1.73_m2} (ref >59)

## 2022-05-04 LAB — TSH: TSH: 0.73 u[IU]/mL (ref 0.450–4.500)

## 2022-05-04 LAB — LIPID PANEL W/O CHOL/HDL RATIO
Cholesterol, Total: 170 mg/dL (ref 100–199)
HDL: 57 mg/dL (ref >39)
LDL Chol Calc (NIH): 93 mg/dL (ref 0–99)
Triglycerides: 111 mg/dL (ref 0–149)
VLDL Cholesterol Cal: 20 mg/dL (ref 5–40)

## 2022-05-09 NOTE — Telephone Encounter (Signed)
Patient reported that she no longer wants an appointment

## 2022-10-21 ENCOUNTER — Telehealth: Payer: Self-pay | Admitting: Internal Medicine

## 2022-10-21 NOTE — Telephone Encounter (Signed)
Pt reported she has had a cough and feeling short of breath for three days now. Chills and congestion as well. She also reported she feels pressure on her chest. She remembers getting a nebulizer before when she had the same symptoms and wonders if she needs it again.

## 2022-10-21 NOTE — Telephone Encounter (Signed)
After speaking with Dr. Amil Amen patient was recommended to go to urgent care to get seen and make sure it's not pneumonia.

## 2022-11-19 ENCOUNTER — Other Ambulatory Visit: Payer: Self-pay | Admitting: Internal Medicine

## 2023-04-10 ENCOUNTER — Telehealth: Payer: Self-pay

## 2023-04-10 NOTE — Telephone Encounter (Signed)
Patient would like a CPE appointment sooner than her appointment on 10/10/4.  We will call when an appointment becomes available.

## 2023-04-20 ENCOUNTER — Other Ambulatory Visit: Payer: Self-pay | Admitting: Internal Medicine

## 2023-04-25 ENCOUNTER — Other Ambulatory Visit: Payer: Self-pay

## 2023-04-28 ENCOUNTER — Encounter: Payer: HRSA Program | Admitting: Internal Medicine

## 2023-06-13 NOTE — Telephone Encounter (Signed)
Offer patient an appointment, patient did not take.

## 2023-07-17 ENCOUNTER — Encounter: Payer: HRSA Program | Admitting: Internal Medicine

## 2023-07-18 ENCOUNTER — Other Ambulatory Visit: Payer: Self-pay

## 2023-07-18 MED ORDER — CITALOPRAM HYDROBROMIDE 20 MG PO TABS: ORAL_TABLET | ORAL | 3 refills | Status: DC

## 2023-07-20 ENCOUNTER — Encounter: Payer: HRSA Program | Admitting: Internal Medicine

## 2023-08-03 ENCOUNTER — Telehealth: Payer: Self-pay

## 2023-08-03 NOTE — Telephone Encounter (Signed)
Patient called to ask for an appointment , for symptoms burning at urination and itchiness .   Offered patient to come and leave a Urine sample or an acute appointment for Monday . Patient denied.

## 2023-08-03 NOTE — Telephone Encounter (Signed)
Patient no-show to her appointment

## 2023-08-07 NOTE — Telephone Encounter (Signed)
Patient has been scheduled

## 2023-08-08 ENCOUNTER — Ambulatory Visit: Payer: Self-pay | Admitting: Internal Medicine

## 2023-08-08 ENCOUNTER — Encounter: Payer: Self-pay | Admitting: Internal Medicine

## 2023-08-08 VITALS — BP 114/80 | HR 76 | Resp 16 | Ht 60.0 in | Wt 158.0 lb

## 2023-08-08 DIAGNOSIS — Z79899 Other long term (current) drug therapy: Secondary | ICD-10-CM

## 2023-08-08 DIAGNOSIS — N898 Other specified noninflammatory disorders of vagina: Secondary | ICD-10-CM

## 2023-08-08 DIAGNOSIS — R300 Dysuria: Secondary | ICD-10-CM

## 2023-08-08 DIAGNOSIS — E890 Postprocedural hypothyroidism: Secondary | ICD-10-CM

## 2023-08-08 DIAGNOSIS — Z23 Encounter for immunization: Secondary | ICD-10-CM

## 2023-08-08 LAB — POCT URINALYSIS DIPSTICK
Bilirubin, UA: NEGATIVE
Glucose, UA: NEGATIVE
Ketones, UA: NEGATIVE
Leukocytes, UA: NEGATIVE
Nitrite, UA: NEGATIVE
Protein, UA: NEGATIVE
Spec Grav, UA: 1.01 (ref 1.010–1.025)
Urobilinogen, UA: 0.2 U/dL
pH, UA: 6 (ref 5.0–8.0)

## 2023-08-08 LAB — POCT WET PREP WITH KOH
Clue Cells Wet Prep HPF POC: NEGATIVE
KOH Prep POC: NEGATIVE
Trichomonas, UA: NEGATIVE

## 2023-08-08 MED ORDER — FLUCONAZOLE 150 MG PO TABS: ORAL_TABLET | ORAL | 0 refills | Status: DC

## 2023-08-08 NOTE — Progress Notes (Signed)
Subjective:    Patient ID: Mary Mcintosh, female   DOB: December 24, 1990, 32 y.o.   MRN: [id number]   HPI  Tereasa Coop interprets   Burning with urination in suprapubic area.  Itching around vaginal area.  Having a creamy white discharge without odor.  Dysuria and vaginal discharge and itching started about same time about 1 week ago.   Has used some sort of cream from pharmacy, but caused more burning.  Miconazole 3.   No fever. No flank tenderness.   Has not used any antibiotics prior to symptoms.    2.  Hypothyroidism:  Levothyroxine not prescribed since 2022.  Patient states, however, she is taking regularly and obtains through Dr. Sharl Ma, her endocrinologist.  States labs done recently and she was informed were fine.  3.  HM:  due for influenza, COVID and Td  Current Meds  Medication Sig   citalopram (CELEXA) 20 MG tablet Take 1 tablet by mouth once daily   levothyroxine (SYNTHROID) 100 MCG tablet Take 100 mcg by mouth daily before breakfast.   No Known Allergies   Review of Systems    Objective:   BP 114/80 (BP Location: Left Arm, Patient Position: Sitting, Cuff Size: Normal)   Pulse 76   Resp 16   Ht 5' (1.524 m)   Wt 158 lb (71.7 kg)   LMP 07/25/2023 (Approximate)   BMI 30.86 kg/m   Physical Exam NAD Lungs:  CTA CV: RRR without murmur or rub.  Radial and DP pulses normal and equal Abd:  S, +  BS, No HSM or mass,   No flank tenderness.  Mild suprapubic tenderness.  No rebound or peritoneal signs.   GU:  Vaginal mucosa very dry, almost atrophic appearing.  No lesion of mucosa or cervix.  No CMT,  No uterine or adnexal mass or tenderness.     Assessment & Plan   Dysuria:  UA possibly with trace blood.  Send for culture.  Exam not clarifying.  Call if sx worse.  We should have results in a couple of days.  2.  Possible yeast vaginitis:  denies use of intravaginal cleansers/douche.  Wet prep with minimal cellularity but possible one budding  yeast--Fluconazole for 2 days to see if helpful  3.  Hypothyroidism:  As per Dr. Sharl Ma.  4.  HM:  CMP.  Out of influenza vaccine today.  Td and Spikevax/covid.

## 2023-08-09 LAB — SPECIMEN STATUS

## 2023-08-10 LAB — URINE CULTURE: Organism ID, Bacteria: NO GROWTH

## 2023-08-10 LAB — GC/CHLAMYDIA PROBE AMP
Chlamydia trachomatis, NAA: NEGATIVE
Neisseria Gonorrhoeae by PCR: NEGATIVE

## 2023-08-11 LAB — COMPREHENSIVE METABOLIC PANEL
ALT: 17 [IU]/L (ref 0–32)
AST: 15 [IU]/L (ref 0–40)
Albumin: 4.6 g/dL (ref 3.9–4.9)
Alkaline Phosphatase: 65 [IU]/L (ref 44–121)
BUN/Creatinine Ratio: 13 (ref 9–23)
BUN: 9 mg/dL (ref 6–20)
Bilirubin Total: 0.3 mg/dL (ref 0.0–1.2)
CO2: 21 mmol/L (ref 20–29)
Calcium: 9.6 mg/dL (ref 8.7–10.2)
Chloride: 103 mmol/L (ref 96–106)
Creatinine, Ser: 0.7 mg/dL (ref 0.57–1.00)
Globulin, Total: 2.9 g/dL (ref 1.5–4.5)
Glucose: 80 mg/dL (ref 70–99)
Potassium: 4.1 mmol/L (ref 3.5–5.2)
Sodium: 139 mmol/L (ref 134–144)
Total Protein: 7.5 g/dL (ref 6.0–8.5)
eGFR: 116 mL/min/{1.73_m2} (ref >59)

## 2023-08-11 LAB — TSH: TSH: 1.43 u[IU]/mL (ref 0.450–4.500)

## 2023-09-02 LAB — CBC WITH DIFFERENTIAL/PLATELET
Basophils Absolute: 0.1 10*3/uL (ref 0.0–0.2)
Basos: 1 %
EOS (ABSOLUTE): 0.1 10*3/uL (ref 0.0–0.4)
Eos: 1 %
Hematocrit: 40.5 % (ref 34.0–46.6)
Hemoglobin: 13 g/dL (ref 11.1–15.9)
Immature Grans (Abs): 0 10*3/uL (ref 0.0–0.1)
Immature Granulocytes: 0 %
Lymphocytes Absolute: 2 10*3/uL (ref 0.7–3.1)
Lymphs: 28 %
MCH: 30.5 pg (ref 26.6–33.0)
MCHC: 32.1 g/dL (ref 31.5–35.7)
MCV: 95 fL (ref 79–97)
Monocytes Absolute: 0.5 10*3/uL (ref 0.1–0.9)
Monocytes: 8 %
Neutrophils Absolute: 4.5 10*3/uL (ref 1.4–7.0)
Neutrophils: 62 %
Platelets: 287 10*3/uL (ref 150–450)
RBC: 4.26 x10E6/uL (ref 3.77–5.28)
RDW: 11.7 % (ref 11.7–15.4)
WBC: 7.2 10*3/uL (ref 3.4–10.8)

## 2023-09-02 LAB — SPECIMEN STATUS REPORT

## 2023-11-14 ENCOUNTER — Ambulatory Visit: Payer: HRSA Program | Admitting: Internal Medicine

## 2023-11-24 ENCOUNTER — Encounter: Payer: Self-pay | Admitting: Internal Medicine

## 2023-12-05 ENCOUNTER — Other Ambulatory Visit: Payer: Self-pay | Admitting: Internal Medicine

## 2024-03-11 ENCOUNTER — Telehealth: Payer: Self-pay

## 2024-03-11 NOTE — Telephone Encounter (Signed)
 Patient's CPE appointment was reschedule due to board meeting.   Patient would like to be on wait list for cancellations, patient will need appointment for medication refills

## 2024-03-13 ENCOUNTER — Ambulatory Visit: Payer: HRSA Program | Admitting: Internal Medicine

## 2024-03-26 ENCOUNTER — Encounter: Payer: HRSA Program | Admitting: Internal Medicine

## 2024-03-27 ENCOUNTER — Other Ambulatory Visit: Payer: Self-pay | Admitting: Internal Medicine

## 2024-04-04 ENCOUNTER — Telehealth: Payer: Self-pay | Admitting: Internal Medicine

## 2024-04-04 NOTE — Telephone Encounter (Signed)
 Done through Rx request

## 2024-04-04 NOTE — Telephone Encounter (Signed)
 Patient needs a refill of citalopram  .  Patient states she completely finished medication.  Notified patient to call office once she refills last refill of her prescription ,

## 2024-04-04 NOTE — Telephone Encounter (Signed)
 Patient notified Rx sent

## 2024-05-01 NOTE — Telephone Encounter (Signed)
Called patient to offer appointment, patient did not answer.

## 2024-05-03 NOTE — Telephone Encounter (Signed)
 Patient has been scheduled for 05/06/24.  Patient has been made aware that she currently has two no shows and if she does not show for this appointment on Monday . She will be dismissed from clinic as this will be the third no show.

## 2024-05-06 ENCOUNTER — Encounter: Admitting: Internal Medicine

## 2024-05-06 ENCOUNTER — Telehealth: Payer: Self-pay | Admitting: Internal Medicine

## 2024-05-06 NOTE — Telephone Encounter (Signed)
 Patient's scheduled CPE was cancel due to provider out wick.  Patient would like to be on wait list for sooner appointment.

## 2024-09-03 ENCOUNTER — Other Ambulatory Visit: Payer: Self-pay | Admitting: Internal Medicine

## 2024-10-14 ENCOUNTER — Ambulatory Visit: Admitting: Internal Medicine

## 2024-10-14 ENCOUNTER — Encounter: Payer: Self-pay | Admitting: Internal Medicine

## 2024-10-14 ENCOUNTER — Other Ambulatory Visit: Payer: Self-pay | Admitting: Internal Medicine

## 2024-10-14 ENCOUNTER — Encounter: Admitting: Internal Medicine

## 2024-10-14 VITALS — BP 124/80 | HR 75 | Resp 12 | Ht 61.0 in | Wt 156.0 lb

## 2024-10-14 DIAGNOSIS — K219 Gastro-esophageal reflux disease without esophagitis: Secondary | ICD-10-CM | POA: Insufficient documentation

## 2024-10-14 DIAGNOSIS — Z124 Encounter for screening for malignant neoplasm of cervix: Secondary | ICD-10-CM

## 2024-10-14 DIAGNOSIS — Z0000 Encounter for general adult medical examination without abnormal findings: Secondary | ICD-10-CM

## 2024-10-14 DIAGNOSIS — Z630 Problems in relationship with spouse or partner: Secondary | ICD-10-CM

## 2024-10-14 DIAGNOSIS — L719 Rosacea, unspecified: Secondary | ICD-10-CM | POA: Insufficient documentation

## 2024-10-14 MED ORDER — CALCIUM CITRATE 250 MG PO TABS: ORAL_TABLET | ORAL | Status: AC

## 2024-10-14 MED ORDER — FAMOTIDINE 20 MG PO TABS: ORAL_TABLET | ORAL | 2 refills | Status: AC

## 2024-10-14 MED ORDER — CHOLECALCIFEROL 10 MCG (400 UNIT) PO CAPS: ORAL_CAPSULE | ORAL | Status: AC

## 2024-10-14 NOTE — Progress Notes (Signed)
 "   Subjective:    Patient ID: Mary Mcintosh, female   DOB: [birth year], 34 y.o.   MRN: [id number]   HPI  CPE with pap  1.  Pap: Last in 04/2022 and normal.  Stable partner--same as in 2023.    2.  Mammogram:  Never.  No family history of breast cancer.    3.  Osteoprevention:  Not consistent with milk products.  Only 3 times.  4.  Guaiac Cards/FIT:  Never.    5.  Colonoscopy:  Never.  No family history of colon cancer.    6.  Immunizations:  Has not had flu nor COVID vaccine this year.   Immunization History  Administered Date(s) Administered   Influenza Split 08/16/2012   Influenza,inj,Quad PF,6+ Mos 08/07/2019   Moderna Covid-19 Fall Seasonal Vaccine 49yrs & older 08/08/2023   Moderna Sars-Covid-2 Vaccination 03/02/2020, 03/30/2020   Td 08/08/2023   Tdap 06/06/2013     7.  Glucose/Cholesterol:  Glucose and lipids fine in past. Lipid Panel     Component Value Date/Time   CHOL 170 05/03/2022 0840   TRIG 111 05/03/2022 0840   HDL 57 05/03/2022 0840   CHOLHDL 2.1 08/15/2012 0806   VLDL 12 08/15/2012 0806   LDLCALC 93 05/03/2022 0840   LABVLDL 20 05/03/2022 0840     Current Meds  Medication Sig   citalopram  (CELEXA ) 20 MG tablet Take 1 tablet by mouth once daily   levothyroxine  (SYNTHROID ) 100 MCG tablet Take 100 mcg by mouth daily before breakfast.   No Known Allergies  Past Medical History:  Diagnosis Date   Anxiety ~ 2009   once; only for a short time (08/15/2012)   Anxiety    I had anxiety and was prescribed medications, but I stopped taking the medications because it made me feel worse (10/31/2019)   Depression ~ 2009   once; only for a short time (08/15/2012)   Migraines    I think they're migraines, I get them often (08/15/2012)   Pancreatitis 08/15/2012   Pre-eclampsia 2012   with last child    Swelling of lower limb    both legs; when I work (08/15/2012)   Thyroid  cancer (HCC) 2021   Papillary thyroid  cancer.  Followed by  Dr. Faythe.   Varicose veins of both lower extremities with pain    when I work (08/15/2012)   Past Surgical History:  Procedure Laterality Date   CHOLECYSTECTOMY  08/18/2012   Procedure: LAPAROSCOPIC CHOLECYSTECTOMY WITH INTRAOPERATIVE CHOLANGIOGRAM;  Surgeon: Lynda Leos, MD;  Location: [facility] OR;  Service: General;  Laterality: N/A;   THYROIDECTOMY N/A 11/04/2019   Procedure: TOTAL THYROIDECTOMY;  Surgeon: Jesus Oliphant, MD;  Location: [facility] OR;  Service: ENT;  Laterality: N/A;   Family History  Problem Relation Age of Onset   Hyperlipidemia Mother    GER disease Mother    Alcohol abuse Father    Cirrhosis Father    Social History   Socioeconomic History   Marital status: Domestic Partner    Spouse name: Sharrell   Number of children: 2   Years of education: 9th   Highest education level: Not on file  Occupational History   Occupation: Physicist, Medical Employee/cook  Tobacco Use   Smoking status: Never    Passive exposure: Never   Smokeless tobacco: Never   Tobacco comments:    i've never smoked 10/31/19  Vaping Use   Vaping status: Never Used  Substance and Sexual Activity   Alcohol use: Yes  Comment: rare   Drug use: No   Sexual activity: Yes    Birth control/protection: None    Comment: 10/2024:  discussed options for [facility].  Other Topics Concern   Not on file  Social History Narrative   Lives in [place] with her boyfriend and their two children   Social Drivers of Health   Tobacco Use: Low Risk (10/14/2024)   Patient History    Smoking Tobacco Use: Never    Smokeless Tobacco Use: Never    Passive Exposure: Never  Financial Resource Strain: Low Risk (10/14/2024)   Overall Financial Resource Strain (CARDIA)    Difficulty of Paying Living Expenses: Not very hard  Food Insecurity: No Food Insecurity (10/14/2024)   Epic    Worried About Radiation Protection Practitioner of Food in the Last Year: Never true    Ran Out of Food in the Last Year: Never true  Transportation Needs: No  Transportation Needs (10/14/2024)   Epic    Lack of Transportation (Medical): No    Lack of Transportation (Non-Medical): No  Physical Activity: Not on file  Stress: Not on file  Social Connections: Unknown (02/18/2022)   Received from [facility]   Social Network    Social Network: Not on file  Intimate Partner Violence: Not At Risk (10/14/2024)   Epic    Fear of Current or Ex-Partner: No    Emotionally Abused: No    Physically Abused: No    Sexually Abused: No  Depression (PHQ2-9): Not on file  Alcohol Screen: Not on file  Housing: Low Risk (04/21/2022)   Housing    Last Housing Risk Score: 0  Utilities: Not At Risk (10/14/2024)   Epic    Threatened with loss of utilities: No  Health Literacy: Not on file      Review of Systems  Constitutional:  Negative for fatigue and unexpected weight change.  HENT:  Negative for dental problem (Has not been to a dentist in over a year.  Did have [facility] orange card).   Eyes:  Negative for visual disturbance.  Respiratory:  Negative for shortness of breath.   Cardiovascular:  Negative for chest pain.  Gastrointestinal:  Negative for abdominal pain.       Sense of something caught in throat for about 1 year.  More recently with epigastric upset after eating. Drinks a lot of coffee.  No soda, tomatoes, chocolate, onions, tobacco or alcohol. Does not lie down after eating  HOB not elevated.  Endocrine:       Was in in 06/2024 with Dr. Faythe, endocrine.  All well.  TSH was fine.  Neurological:  Positive for numbness (Fingers on both hands awaken her from sleep.  Perhaps once every 2 weeks.).  Psychiatric/Behavioral:  Negative for dysphoric mood (Received counseling with Ileana Tol in 2022 and is doing well on Celexa .) and suicidal ideas. The patient is not nervous/anxious.       Objective:   BP 124/80 (BP Location: Right Arm, Patient Position: Sitting, Cuff Size: Normal)   Pulse 75   Resp 12   Ht 5' 1 (1.549 m)   Wt 156 lb (70.8 kg)    LMP 09/23/2024   BMI 29.48 kg/m   Physical Exam HENT:     Head: Normocephalic and atraumatic.     Right Ear: Tympanic membrane, ear canal and external ear normal.     Left Ear: Tympanic membrane, ear canal and external ear normal.     Nose: Nose normal.  Mouth/Throat:     Mouth: Mucous membranes are moist.     Pharynx: Oropharynx is clear.  Eyes:     Extraocular Movements: Extraocular movements intact.     Conjunctiva/sclera: Conjunctivae normal.     Pupils: Pupils are equal, round, and reactive to light.     Comments: Discs sharp  Neck:     Thyroid : No thyroid  mass or thyromegaly.      Comments: Well healed thyroidectomy scar Cardiovascular:     Rate and Rhythm: Normal rate and regular rhythm.     Heart sounds: S1 normal and S2 normal. No murmur heard.    No friction rub. No S3 or S4 sounds.     Comments: Carotid, radial, femoral, DP and PT pulses normal and equal.   Pulmonary:     Effort: Pulmonary effort is normal.     Breath sounds: Normal breath sounds and air entry.  Chest:  Breasts:    Right: No inverted nipple, mass or nipple discharge.     Left: No inverted nipple, mass or nipple discharge.  Abdominal:     General: Bowel sounds are normal.     Palpations: Abdomen is soft. There is no hepatomegaly, splenomegaly or mass.     Tenderness: There is no abdominal tenderness.     Hernia: No hernia is present.  Genitourinary:    Comments: Normal external female genitalia Old blood in os and about cervix No cervical or vaginal mucosal lesions No uterine or adnexal mass or tenderness Musculoskeletal:        General: Normal range of motion.     Cervical back: Normal range of motion and neck supple.     Right lower leg: No edema.     Left lower leg: No edema.  Lymphadenopathy:     Head:     Right side of head: No submental or submandibular adenopathy.     Left side of head: No submental or submandibular adenopathy.     Cervical: No cervical adenopathy.     Upper  Body:     Right upper body: No supraclavicular or axillary adenopathy.     Left upper body: No supraclavicular or axillary adenopathy.     Lower Body: No right inguinal adenopathy. No left inguinal adenopathy.  Skin:    General: Skin is warm.     Capillary Refill: Capillary refill takes less than 2 seconds.     Findings: No rash.     Comments: Cheeks and chin flushed with prominent angiomatas/vessels.  Spares nasolabial folds.  Neurological:     General: No focal deficit present.     Mental Status: She is alert and oriented to person, place, and time.     Cranial Nerves: Cranial nerves 2-12 are intact.     Sensory: Sensation is intact.     Motor: Motor function is intact.     Coordination: Coordination is intact.     Gait: Gait is intact.     Deep Tendon Reflexes: Reflexes are normal and symmetric.  Psychiatric:        Mood and Affect: Mood normal.        Speech: Speech normal.        Behavior: Behavior normal. Behavior is cooperative.      Assessment & Plan   CPE with pap/HPV If normal, will have her follow up in 5 years. Encouraged obtaining HPV vaccination at [facility] She opted out of COvID vaccination today--will obtain when in for fasting labs in 1 week.  2.  Dysphagia with probable GERD:  Famotidine  40 mg daily for 2 months, then wean over a 3rd month.  Call if no improvement in 2 weeks.  Elevate HOB and cut back on coffee.  3.  Hx of papillary thyroid  cancer, followed by Dr. Faythe.    4.  Rosacea without acne:  follow.    5.  Marital conflict due to husband's use of alcohol:  referral to [facility], [facility] for counseling. "

## 2024-10-15 LAB — CYTOLOGY - PAP

## 2024-10-22 ENCOUNTER — Other Ambulatory Visit

## 2024-10-22 DIAGNOSIS — Z13220 Encounter for screening for lipoid disorders: Secondary | ICD-10-CM

## 2024-10-22 DIAGNOSIS — Z23 Encounter for immunization: Secondary | ICD-10-CM

## 2024-10-22 DIAGNOSIS — E890 Postprocedural hypothyroidism: Secondary | ICD-10-CM

## 2024-10-22 DIAGNOSIS — Z79899 Other long term (current) drug therapy: Secondary | ICD-10-CM

## 2024-10-22 NOTE — Progress Notes (Signed)
 The patient said that she in not feeling okay to get vaccins, She did not get them today

## 2024-10-23 LAB — CBC WITH DIFFERENTIAL/PLATELET
Basophils Absolute: 0.1 x10E3/uL (ref 0.0–0.2)
Basos: 1 %
EOS (ABSOLUTE): 0.1 x10E3/uL (ref 0.0–0.4)
Eos: 1 %
Hematocrit: 39.5 % (ref 34.0–46.6)
Hemoglobin: 12.7 g/dL (ref 11.1–15.9)
Immature Grans (Abs): 0 x10E3/uL (ref 0.0–0.1)
Immature Granulocytes: 0 %
Lymphocytes Absolute: 2.4 x10E3/uL (ref 0.7–3.1)
Lymphs: 33 %
MCH: 30.2 pg (ref 26.6–33.0)
MCHC: 32.2 g/dL (ref 31.5–35.7)
MCV: 94 fL (ref 79–97)
Monocytes Absolute: 0.6 x10E3/uL (ref 0.1–0.9)
Monocytes: 8 %
Neutrophils Absolute: 4.2 x10E3/uL (ref 1.4–7.0)
Neutrophils: 57 %
Platelets: 322 x10E3/uL (ref 150–450)
RBC: 4.21 x10E6/uL (ref 3.77–5.28)
RDW: 11.8 % (ref 11.7–15.4)
WBC: 7.3 x10E3/uL (ref 3.4–10.8)

## 2024-10-23 LAB — COMPREHENSIVE METABOLIC PANEL WITH GFR
ALT: 11 IU/L (ref 0–32)
AST: 16 IU/L (ref 0–40)
Albumin: 4.7 g/dL (ref 3.9–4.9)
Alkaline Phosphatase: 84 IU/L (ref 41–116)
BUN/Creatinine Ratio: 13 (ref 9–23)
BUN: 9 mg/dL (ref 6–20)
Bilirubin Total: 0.5 mg/dL (ref 0.0–1.2)
CO2: 22 mmol/L (ref 20–29)
Calcium: 9.1 mg/dL (ref 8.7–10.2)
Chloride: 102 mmol/L (ref 96–106)
Creatinine, Ser: 0.7 mg/dL (ref 0.57–1.00)
Globulin, Total: 3 g/dL (ref 1.5–4.5)
Glucose: 79 mg/dL (ref 70–99)
Potassium: 4.2 mmol/L (ref 3.5–5.2)
Sodium: 142 mmol/L (ref 134–144)
Total Protein: 7.7 g/dL (ref 6.0–8.5)
eGFR: 116 mL/min/1.73

## 2024-10-23 LAB — LIPID PANEL W/O CHOL/HDL RATIO
Cholesterol, Total: 172 mg/dL (ref 100–199)
HDL: 58 mg/dL
LDL Chol Calc (NIH): 98 mg/dL (ref 0–99)
Triglycerides: 89 mg/dL (ref 0–149)
VLDL Cholesterol Cal: 16 mg/dL (ref 5–40)

## 2024-10-23 LAB — TSH: TSH: 0.433 u[IU]/mL — ABNORMAL LOW (ref 0.450–4.500)

## 2025-01-13 ENCOUNTER — Ambulatory Visit: Admitting: Internal Medicine

## 2025-10-14 ENCOUNTER — Other Ambulatory Visit: Admitting: Internal Medicine

## 2025-10-16 ENCOUNTER — Encounter: Admitting: Internal Medicine
